# Patient Record
Sex: Female | Born: 1960 | Race: White | Hispanic: No | Smoking: Current every day smoker
Health system: Southern US, Community
[De-identification: ages and names within clinical notes are randomized; demographics above are authoritative.]

## PROBLEM LIST (undated history)

## (undated) DIAGNOSIS — M419 Scoliosis, unspecified: Secondary | ICD-10-CM

## (undated) DIAGNOSIS — Q899 Congenital malformation, unspecified: Secondary | ICD-10-CM

## (undated) DIAGNOSIS — M459 Ankylosing spondylitis of unspecified sites in spine: Secondary | ICD-10-CM

## (undated) DIAGNOSIS — F32A Depression, unspecified: Secondary | ICD-10-CM

## (undated) DIAGNOSIS — M4800 Spinal stenosis, site unspecified: Secondary | ICD-10-CM

## (undated) DIAGNOSIS — I2510 Atherosclerotic heart disease of native coronary artery without angina pectoris: Secondary | ICD-10-CM

## (undated) DIAGNOSIS — E785 Hyperlipidemia, unspecified: Secondary | ICD-10-CM

## (undated) DIAGNOSIS — D682 Hereditary deficiency of other clotting factors: Secondary | ICD-10-CM

## (undated) DIAGNOSIS — M1990 Unspecified osteoarthritis, unspecified site: Secondary | ICD-10-CM

## (undated) DIAGNOSIS — E119 Type 2 diabetes mellitus without complications: Secondary | ICD-10-CM

## (undated) DIAGNOSIS — F329 Major depressive disorder, single episode, unspecified: Secondary | ICD-10-CM

## (undated) DIAGNOSIS — F909 Attention-deficit hyperactivity disorder, unspecified type: Secondary | ICD-10-CM

## (undated) DIAGNOSIS — Z87828 Personal history of other (healed) physical injury and trauma: Secondary | ICD-10-CM

## (undated) DIAGNOSIS — N289 Disorder of kidney and ureter, unspecified: Secondary | ICD-10-CM

## (undated) DIAGNOSIS — E063 Autoimmune thyroiditis: Secondary | ICD-10-CM

## (undated) HISTORY — PX: CORONARY ANGIOPLASTY: SHX604

## (undated) HISTORY — PX: TUBAL LIGATION: SHX77

## (undated) HISTORY — PX: CARDIAC CATHETERIZATION: SHX172

## (undated) HISTORY — PX: CHOLECYSTECTOMY: SHX55

---

## 2011-09-22 ENCOUNTER — Ambulatory Visit: Payer: Self-pay

## 2015-12-01 ENCOUNTER — Encounter: Payer: Self-pay | Admitting: Emergency Medicine

## 2015-12-01 ENCOUNTER — Emergency Department
Admission: EM | Admit: 2015-12-01 | Discharge: 2015-12-03 | Disposition: A | Discharge status: Other Acute Inpatient Hospital | Payer: Medicare Other | Attending: Student in an Organized Health Care Education/Training Program | Admitting: Student in an Organized Health Care Education/Training Program

## 2015-12-01 DIAGNOSIS — R45851 Suicidal ideations: Secondary | ICD-10-CM | POA: Diagnosis present

## 2015-12-01 DIAGNOSIS — T1491XA Suicide attempt, initial encounter: Secondary | ICD-10-CM

## 2015-12-01 DIAGNOSIS — T383X2A Poisoning by insulin and oral hypoglycemic [antidiabetic] drugs, intentional self-harm, initial encounter: Secondary | ICD-10-CM | POA: Diagnosis not present

## 2015-12-01 DIAGNOSIS — IMO0001 Reserved for inherently not codable concepts without codable children: Secondary | ICD-10-CM

## 2015-12-01 DIAGNOSIS — Z7984 Long term (current) use of oral hypoglycemic drugs: Secondary | ICD-10-CM | POA: Insufficient documentation

## 2015-12-01 DIAGNOSIS — F322 Major depressive disorder, single episode, severe without psychotic features: Secondary | ICD-10-CM

## 2015-12-01 DIAGNOSIS — E119 Type 2 diabetes mellitus without complications: Secondary | ICD-10-CM

## 2015-12-01 DIAGNOSIS — F17200 Nicotine dependence, unspecified, uncomplicated: Secondary | ICD-10-CM | POA: Diagnosis not present

## 2015-12-01 DIAGNOSIS — R079 Chest pain, unspecified: Secondary | ICD-10-CM | POA: Diagnosis not present

## 2015-12-01 HISTORY — DX: Spinal stenosis, site unspecified: M48.00

## 2015-12-01 HISTORY — DX: Scoliosis, unspecified: M41.9

## 2015-12-01 HISTORY — DX: Ankylosing spondylitis of unspecified sites in spine: M45.9

## 2015-12-01 HISTORY — DX: Congenital malformation, unspecified: Q89.9

## 2015-12-01 HISTORY — DX: Autoimmune thyroiditis: E06.3

## 2015-12-01 HISTORY — DX: Hereditary deficiency of other clotting factors: D68.2

## 2015-12-01 HISTORY — DX: Unspecified osteoarthritis, unspecified site: M19.90

## 2015-12-01 HISTORY — DX: Personal history of other (healed) physical injury and trauma: Z87.828

## 2015-12-01 HISTORY — DX: Disorder of kidney and ureter, unspecified: N28.9

## 2015-12-01 LAB — CBC
HCT: 48.2 % — ABNORMAL HIGH (ref 35.0–47.0)
Hemoglobin: 16.4 g/dL — ABNORMAL HIGH (ref 12.0–16.0)
MCH: 31.3 pg (ref 26.0–34.0)
MCHC: 34 g/dL (ref 32.0–36.0)
MCV: 92.1 fL (ref 80.0–100.0)
PLATELETS: 204 10*3/uL (ref 150–440)
RBC: 5.24 MIL/uL — ABNORMAL HIGH (ref 3.80–5.20)
RDW: 13.2 % (ref 11.5–14.5)
WBC: 9.7 10*3/uL (ref 3.6–11.0)

## 2015-12-01 LAB — ACETAMINOPHEN LEVEL

## 2015-12-01 LAB — COMPREHENSIVE METABOLIC PANEL
ALK PHOS: 104 U/L (ref 38–126)
ALT: 14 U/L (ref 14–54)
ANION GAP: 9 (ref 5–15)
AST: 16 U/L (ref 15–41)
Albumin: 4.1 g/dL (ref 3.5–5.0)
BILIRUBIN TOTAL: 0.7 mg/dL (ref 0.3–1.2)
BUN: 12 mg/dL (ref 6–20)
CALCIUM: 9.6 mg/dL (ref 8.9–10.3)
CO2: 21 mmol/L — AB (ref 22–32)
Chloride: 106 mmol/L (ref 101–111)
Creatinine, Ser: 0.6 mg/dL (ref 0.44–1.00)
GFR calc non Af Amer: >60 mL/min (ref >60)
Glucose, Bld: 140 mg/dL — ABNORMAL HIGH (ref 65–99)
Potassium: 3.9 mmol/L (ref 3.5–5.1)
SODIUM: 136 mmol/L (ref 135–145)
TOTAL PROTEIN: 7.8 g/dL (ref 6.5–8.1)

## 2015-12-01 LAB — ETHANOL

## 2015-12-01 LAB — GLUCOSE, CAPILLARY
GLUCOSE-CAPILLARY: 137 mg/dL — AB (ref 65–99)
Glucose-Capillary: 139 mg/dL — ABNORMAL HIGH (ref 65–99)

## 2015-12-01 LAB — SALICYLATE LEVEL

## 2015-12-01 MED ORDER — METFORMIN HCL 500 MG PO TABS: 500.0000 mg | ORAL_TABLET | Freq: Two times a day (BID) | ORAL | Status: DC

## 2015-12-01 MED ORDER — LINAGLIPTIN 5 MG PO TABS
5.0000 mg | ORAL_TABLET | Freq: Every day | ORAL | Status: DC
Start: 2015-12-01 — End: 2015-12-03

## 2015-12-01 MED ORDER — INSULIN ASPART 100 UNIT/ML ~~LOC~~ SOLN: 0.0000 [IU] | Freq: Three times a day (TID) | SUBCUTANEOUS | Status: DC

## 2015-12-01 MED ORDER — HALOPERIDOL LACTATE 5 MG/ML IJ SOLN: 5.0000 mg | Freq: Once | INTRAMUSCULAR | Status: DC

## 2015-12-01 MED ORDER — MIDAZOLAM HCL 2 MG/2ML IJ SOLN
2.0000 mg | Freq: Once | INTRAMUSCULAR | Status: AC
  Administered 2015-12-01: 2 mg via INTRAMUSCULAR
  Filled 2015-12-01: qty 2

## 2015-12-01 MED ORDER — ZIPRASIDONE MESYLATE 20 MG IM SOLR
15.0000 mg | Freq: Once | INTRAMUSCULAR | Status: AC
  Administered 2015-12-01: 15 mg via INTRAMUSCULAR

## 2015-12-01 MED ORDER — ZIPRASIDONE MESYLATE 20 MG IM SOLR
INTRAMUSCULAR | Status: AC
  Administered 2015-12-01: 15 mg via INTRAMUSCULAR
  Filled 2015-12-01: qty 20

## 2015-12-01 NOTE — ED Provider Notes (Signed)
[facility] Emergency Department Provider Note    None    (approximate)  I have reviewed the triage vital signs and the nursing notes.   HISTORY  Chief Complaint Suicidal    HPI Melanie Irwin is a 55 y.o. female multiple comorbidities presents with reported overdose of July B last night. States that she ingested over 30 pills. Patient states that she no R was to live. In ER patient appears very disorganized very poor historian. States that her future plan for suicidal be to overdose. She denies any headache, chest pain, hallucinations, nausea, vomiting, diarrhea or fevers. Denies any other substance abuse.   Past Medical History:  Diagnosis Date  . Ankylosing spondylitis (HCC)   . Arthritis   . Birth defect    Spinal cord  . Factor II deficiency (HCC)   . Hashimoto's thyroiditis   . History of spinal cord injury   . Inflammatory arthritis   . Renal disorder   . Scoliosis   . Spinal stenosis    No family history on file. Past Surgical History:  Procedure Laterality Date  . CHOLECYSTECTOMY    . TUBAL LIGATION     There are no active problems to display for this patient.     Prior to Admission medications   Not on File    Allergies Vancomycin    Social History Social History  Substance Use Topics  . Smoking status: Current Every Day Smoker  . Smokeless tobacco: Never Used  . Alcohol use No    Review of Systems Patient denies headaches, rhinorrhea, blurry vision, numbness, shortness of breath, chest pain, edema, cough, abdominal pain, nausea, vomiting, diarrhea, dysuria, fevers, rashes or hallucinations unless otherwise stated above in HPI. ____________________________________________   PHYSICAL EXAM:  VITAL SIGNS: Vitals:   12/01/15 1449  BP: 118/84  Pulse: 92  Resp: 16  Temp: 97.6 F (36.4 C)    Constitutional: Alert and oriented. Eyes: Conjunctivae are normal. PERRL. EOMI. Head: Atraumatic. Nose: No  congestion/rhinnorhea. Mouth/Throat: Mucous membranes are moist.  Oropharynx non-erythematous. Neck: No stridor. Painless ROM. No cervical spine tenderness to palpation Hematological/Lymphatic/Immunilogical: No cervical lymphadenopathy. Cardiovascular: Normal rate, regular rhythm. Grossly normal heart sounds.  Good peripheral circulation. Respiratory: Normal respiratory effort.  No retractions. Lungs CTAB. Gastrointestinal: Soft and nontender. No distention. No abdominal bruits. No CVA tenderness.  Musculoskeletal: No lower extremity tenderness nor edema.  No joint effusions. Neurologic:  Normal speech and language. No gross focal neurologic deficits are appreciated. No gait instability. Skin:  Skin is warm, dry and intact. No rash noted. Psychiatric:disorganized, agitated, pacing in hall ____________________________________________   LABS (all labs ordered are listed, but only abnormal results are displayed)  Results for orders placed or performed during the hospital encounter of 12/01/15 (from the past 24 hour(s))  Comprehensive metabolic panel     Status: Abnormal   Collection Time: 12/01/15  3:06 PM  Result Value Ref Range   Sodium 136 135 - 145 mmol/L   Potassium 3.9 3.5 - 5.1 mmol/L   Chloride 106 101 - 111 mmol/L   CO2 21 (L) 22 - 32 mmol/L   Glucose, Bld 140 (H) 65 - 99 mg/dL   BUN 12 6 - 20 mg/dL   Creatinine, Ser 6.29 0.44 - 1.00 mg/dL   Calcium 9.6 8.9 - 52.8 mg/dL   Total Protein 7.8 6.5 - 8.1 g/dL   Albumin 4.1 3.5 - 5.0 g/dL   AST 16 15 - 41 U/L   ALT 14 14 -  54 U/L   Alkaline Phosphatase 104 38 - 126 U/L   Total Bilirubin 0.7 0.3 - 1.2 mg/dL   GFR calc non Af Amer >60 >60 mL/min   GFR calc Af Amer >60 >60 mL/min   Anion gap 9 5 - 15  Ethanol     Status: None   Collection Time: 12/01/15  3:06 PM  Result Value Ref Range   Alcohol, Ethyl (B) <5 <5 mg/dL  Salicylate level     Status: None   Collection Time: 12/01/15  3:06 PM  Result Value Ref Range   Salicylate  Lvl <7.0 2.8 - 30.0 mg/dL  Acetaminophen level     Status: Abnormal   Collection Time: 12/01/15  3:06 PM  Result Value Ref Range   Acetaminophen (Tylenol), Serum <10 (L) 10 - 30 ug/mL  cbc     Status: Abnormal   Collection Time: 12/01/15  3:06 PM  Result Value Ref Range   WBC 9.7 3.6 - 11.0 K/uL   RBC 5.24 (H) 3.80 - 5.20 MIL/uL   Hemoglobin 16.4 (H) 12.0 - 16.0 g/dL   HCT 32.448.2 (H) 40.135.0 - 02.747.0 %   MCV 92.1 80.0 - 100.0 fL   MCH 31.3 26.0 - 34.0 pg   MCHC 34.0 32.0 - 36.0 g/dL   RDW 25.313.2 66.411.5 - 40.314.5 %   Platelets 204 150 - 440 K/uL  Glucose, capillary     Status: Abnormal   Collection Time: 12/01/15  3:07 PM  Result Value Ref Range   Glucose-Capillary 139 (H) 65 - 99 mg/dL   ____________________________________________ ____________________________________________  RADIOLOGY   ____________________________________________   PROCEDURES  Procedure(s) performed: none Procedures    Critical Care performed: no ____________________________________________   INITIAL IMPRESSION / ASSESSMENT AND PLAN / ED COURSE  Pertinent labs & imaging results that were available during my care of the patient were reviewed by me and considered in my medical decision making (see chart for details).  DDX: Psychosis, delirium, medication effect, noncompliance, polysubstance abuse, Si, Hi, depression   Melanie Irwin is a 55 y.o. who presents to the ED with for evaluation of SI.  Patient states that she overdosed on 30 pills of Januvia last night. Not sure that this is accurate as the patient has normal glucose levels and is in no acute distress at nearly 24 hours after ingestion. Will continue to monitor for a period time here in the ER. Will check blood work.    Clinical Course as of Nov 30 2033  Mon Dec 01, 2015  1626 I spoke with poison control who states that now that she is nearly 24 hours out from possible ingestion does not require any further monitoring as her LFTs, renal function  and Tylenol levels are normal.  [PR]    Clinical Course User Index [PR] Willy EddyPatrick Loana Salvaggio, MD     Laboratory testing was ordered to evaluation for underlying electrolyte derangement or signs of underlying organic pathology to explain today's presentation.  Based on history and physical and laboratory evaluation, it appears that the patient's presentation is 2/2 underlying psychiatric disorder and will require further evaluation and management by inpatient psychiatry.  Patient was  made an IVC due to active SI.  Disposition pending psychiatric evaluation.   ____________________________________________   FINAL CLINICAL IMPRESSION(S) / ED DIAGNOSES  Final diagnoses:  Suicidal ideation  Drug ingestion, intentional self-harm, initial encounter (HCC)      NEW MEDICATIONS STARTED DURING THIS VISIT:  New Prescriptions   No medications on file  Note:  This document was prepared using Dragon voice recognition software and may include unintentional dictation errors.    Willy Eddy, MD 12/01/15 2035

## 2015-12-01 NOTE — BH Assessment (Signed)
Assessment Note  Melanie Irwin is an 55 y.o. female. [name] reports to the ED by transportation from her probation officer for suicidal attempt by overdose.  She reported that she took a bottle of Januvia last night to kill herself.  [name] was uncooperative with the TTS.  She denied to answer many of the questions.  She was irritable and kept the blanket pulled over her head.  [name] is reported as being loud and verbally aggressive in the ED. Patient was IVC'd by the physician after making suicidal statements.  Patient would not answer questions on symptoms of depression  or anxiety, nor did she answer whether she was having hallucinations.  Diagnosis: SI  Past Medical History:  Past Medical History:  Diagnosis Date  . Ankylosing spondylitis (HCC)   . Arthritis   . Birth defect    Spinal cord  . Factor II deficiency (HCC)   . Hashimoto's thyroiditis   . History of spinal cord injury   . Inflammatory arthritis   . Renal disorder   . Scoliosis   . Spinal stenosis     Past Surgical History:  Procedure Laterality Date  . CHOLECYSTECTOMY    . TUBAL LIGATION      Family History: No family history on file.  Social History:  reports that she has been smoking.  She has never used smokeless tobacco. She reports that she does not drink alcohol. Her drug history is not on file.  Additional Social History:  Alcohol / Drug Use History of alcohol / drug use?: No history of alcohol / drug abuse  CIWA: CIWA-Ar BP: 118/84 Pulse Rate: 92 COWS:    Allergies:  Allergies  Allergen Reactions  . Vancomycin Anaphylaxis    Home Medications:  (Not in a hospital admission)  OB/GYN Status:  No LMP recorded. Patient is postmenopausal.  General Assessment Data Location of Assessment: [facility] ED TTS Assessment: In system Is this a Tele or Face-to-Face Assessment?: Face-to-Face Is this an Initial Assessment or a Re-assessment for this encounter?: Initial Assessment Marital status:  Divorced [place] name: n/a Is patient pregnant?: No Pregnancy Status: No Living Arrangements: Other (Comment) (Refused to answer) Can pt return to current living arrangement?: Yes Admission Status: Involuntary Is patient capable of signing voluntary admission?: Yes Referral Source: Self/Family/Friend Insurance type: Medicare  Medical Screening Exam [facility] Walk-in ONLY) Medical Exam completed: Yes  Crisis Care Plan Living Arrangements: Other (Comment) (Refused to answer) Legal Guardian: Other: (Self) Name of Psychiatrist: Refused to answer Name of Therapist: Refused to answer  Education Status Is patient currently in school?: No Current Grade: n/a Highest grade of school patient has completed: n/a Name of school: n/a Contact person: n/a  Risk to self with the past 6 months Suicidal Ideation: Yes-Currently Present Has patient been a risk to self within the past 6 months prior to admission? : Other (comment) Suicidal Intent:  (unknown) Has patient had any suicidal intent within the past 6 months prior to admission? : Other (comment) (unknown) Is patient at risk for suicide?: No Suicidal Plan?:  (Would not disclose to TTS) Has patient had any suicidal plan within the past 6 months prior to admission? : Other (comment) (Unknown) Access to Means: No What has been your use of drugs/alcohol within the last 12 months?: denied use Previous Attempts/Gestures:  (Unknown) How many times?:  (unknown) Other Self Harm Risks:  (denied) Triggers for Past Attempts: Unknown Intentional Self Injurious Behavior: None Family Suicide History: Unknown Recent stressful life event(s):  (  would not disclose) Persecutory voices/beliefs?: No Depression: Yes Depression Symptoms: Feeling angry/irritable Substance abuse history and/or treatment for substance abuse?: No Suicide prevention information given to non-admitted patients: Not applicable  Risk to Others within the past 6 months Homicidal  Ideation: No Does patient have any lifetime risk of violence toward others beyond the six months prior to admission? : No Thoughts of Harm to Others: No Current Homicidal Intent: No Current Homicidal Plan: No Access to Homicidal Means: No Identified Victim: None identified History of harm to others?: No Assessment of Violence: None Noted Violent Behavior Description: denied Does patient have access to weapons?: No Criminal Charges Pending?: No Does patient have a court date: No Is patient on probation?: Yes  Psychosis Hallucinations: None noted Delusions: None noted  Mental Status Report Appearance/Hygiene: In scrubs Eye Contact: Poor Motor Activity: Unremarkable Speech: Aggressive Level of Consciousness: Quiet/awake Mood: Irritable Affect: Irritable Anxiety Level: None Thought Processes: Coherent Judgement: Unimpaired Orientation: Person, Place, Time, Situation Obsessive Compulsive Thoughts/Behaviors: None  Cognitive Functioning Concentration: Unable to Assess Memory: Unable to Assess IQ: Average Insight: Unable to Assess Impulse Control: Poor Appetite:  (refused to answer) Sleep: Unable to Assess Vegetative Symptoms: None  ADLScreening [facility]) Patient's cognitive ability adequate to safely complete daily activities?: Yes Patient able to express need for assistance with ADLs?: Yes Independently performs ADLs?: Yes (appropriate for developmental age)  Prior Inpatient Therapy Prior Inpatient Therapy: No (Refused to answer) Prior Therapy Dates: n/a Prior Therapy Facilty/Provider(s): n/a Reason for Treatment: n/a  Prior Outpatient Therapy Prior Outpatient Therapy: No (Refused to answer) Prior Therapy Dates: n/a Prior Therapy Facilty/Provider(s): n/a Reason for Treatment: n/a Does patient have an ACCT team?: No Does patient have Intensive In-House Services?  : No Does patient have [facility] services? : No Does patient have P4CC services?:  No  ADL Screening (condition at time of admission) Patient's cognitive ability adequate to safely complete daily activities?: Yes Patient able to express need for assistance with ADLs?: Yes Independently performs ADLs?: Yes (appropriate for developmental age)       Abuse/Neglect Assessment (Assessment to be complete while patient is alone) Physical Abuse: Denies Verbal Abuse: Denies Sexual Abuse: Denies Exploitation of patient/patient's resources: Denies Self-Neglect: Denies          Additional Information 1:1 In Past 12 Months?: No CIRT Risk: No Elopement Risk: No Does patient have medical clearance?: Yes     Disposition:  Disposition Initial Assessment Completed for this Encounter: Yes Disposition of Patient: Inpatient treatment program  On Site Evaluation by:   Reviewed with Physician:    Justice DeedsKeisha Caidon Foti 12/01/2015 8:38 PM

## 2015-12-01 NOTE — ED Notes (Addendum)
Pt yelling out "oh" and "I want to go home".  Informed pt that behavior was inappropriate, that she was in hall bed and needed to keep voice at reasonable level.  Pt informed by officer that such behavior would likely increase pts lentgh of stay.  Pt continued to yell out, MD Roxan Hockeyobinson informed.  Pt complaint w/ IM injection

## 2015-12-01 NOTE — ED Triage Notes (Addendum)
Patient to ER for c/o taking whole bottle of Januvia (approx 30 pills) last night. States her probation officer brought her to ER to be evaluated. When asked what she is here for, she states "nothing, that she is fine". Patient with arms crossed in triage, and appears mad. Patient answers questions when asked, but is not forthcoming with whole story until directly asked questions.  States pills were taken before midnight. Patient states "I'm just tired of living.".

## 2015-12-01 NOTE — ED Notes (Addendum)
Pt unforthcoming w/ information, repeatedly sts that she does not want to be here.  Pt sts that she took medications last night w/ that intention of hurting self.  Pt sts that she does not currently want to hurt self because "I have things to get in order".  When this RN asked if she would hurt herself again after she attended to her affairs, pt responded yes.

## 2015-12-01 NOTE — ED Notes (Signed)
Pt refusing EKG or in and out cath for urine.

## 2015-12-01 NOTE — Consult Note (Signed)
[facility] Consult   Reason for Consult:  Consult for 55 year old woman who came in to the hospital after taking a overdose Referring Physician:  Quentin Cornwall Patient Identification: Melanie Irwin MRN:  [id number] Principal Diagnosis: Severe major depression, single episode, without psychotic features [facility]) Diagnosis:   Patient Active Problem List   Diagnosis Date Noted  . Severe major depression, single episode, without psychotic features ([place]) [F32.2] 12/01/2015  . Suicide attempt [T14.91XA] 12/01/2015  . Diabetes ([place]) [E11.9] 12/01/2015    Total Time spent with patient: 1 hour  Subjective:   Melanie Irwin is a 55 y.o. female patient admitted with "I took some Januvia to hurt myself".  HPI:  Patient interviewed. Chart reviewed. 55 year old woman came to the emergency room after going to her probation officer today and telling her that she had taken an overdose of her Januvia. Patient says she took about 10 tablets of it yesterday because she wanted to hurt her self. Her mood has been bad recently and she's been having suicidal thoughts and feeling angry at herself and other people. Sleep is poor. Appetite is poor. Symptoms. Says that she has been compliant with her recommended outpatient medication.  Social history: Lives with her sister and several other people. Not clear if she is working right now. On probation.  Medical history: History of ankylosing spondylitis, diabetes, chronic pain  Substance abuse history: She denies any but she is not a very complete historian  Past Psychiatric History: Patient says she has never been in a psychiatric hospital. Used to take Lexapro in the past for depression. Won't tell me whether it was ever helpful. No history of previous suicide attempts  Risk to Self: Suicidal Ideation: Yes-Currently Present Suicidal Intent:  (unknown) Is patient at risk for suicide?: No Suicidal Plan?:  (Would not disclose to TTS) Access to Means:  No What has been your use of drugs/alcohol within the last 12 months?: denied use How many times?:  (unknown) Other Self Harm Risks:  (denied) Triggers for Past Attempts: Unknown Intentional Self Injurious Behavior: None Risk to Others: Homicidal Ideation: No Thoughts of Harm to Others: No Current Homicidal Intent: No Current Homicidal Plan: No Access to Homicidal Means: No Identified Victim: None identified History of harm to others?: No Assessment of Violence: None Noted Violent Behavior Description: denied Does patient have access to weapons?: No Criminal Charges Pending?: No Does patient have a court date: No Prior Inpatient Therapy: Prior Inpatient Therapy: No (Refused to answer) Prior Therapy Dates: n/a Prior Therapy Facilty/Provider(s): n/a Reason for Treatment: n/a Prior Outpatient Therapy: Prior Outpatient Therapy: No (Refused to answer) Prior Therapy Dates: n/a Prior Therapy Facilty/Provider(s): n/a Reason for Treatment: n/a Does patient have an ACCT team?: No Does patient have Intensive In-House Services?  : No Does patient have [facility] services? : No Does patient have P4CC services?: No  Past Medical History:  Past Medical History:  Diagnosis Date  . Ankylosing spondylitis ([place])   . Arthritis   . Birth defect    Spinal cord  . 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 Hashimoto's thyroiditis   . History of spinal cord injury   . Inflammatory arthritis   . Renal disorder   . Scoliosis   . Spinal stenosis     Past Surgical History:  Procedure Laterality Date  . CHOLECYSTECTOMY    . TUBAL LIGATION     Family History: No family history on file. Family Psychiatric  History: Denies family history of mental illness Social History:  History  Alcohol Use No     History  Drug use: Unknown    Social History   Social History  . Marital status: Divorced    Spouse name: N/A  . Number of children: N/A  . Years of education: N/A   Social History Main Topics  .  Smoking status: Current Every Day Smoker  . Smokeless tobacco: Never Used  . Alcohol use No  . Drug use: Unknown  . Sexual activity: Not Asked   Other Topics Concern  . None   Social History Narrative  . None   Additional Social History:    Allergies:   Allergies  Allergen Reactions  . Vancomycin Anaphylaxis    Labs:  Results for orders placed or performed during the hospital encounter of 12/01/15 (from the past 48 hour(s))  Comprehensive metabolic panel     Status: Abnormal   Collection Time: 12/01/15  3:06 PM  Result Value Ref Range   Sodium 136 135 - 145 mmol/L   Potassium 3.9 3.5 - 5.1 mmol/L   Chloride 106 101 - 111 mmol/L   CO2 21 (L) 22 - 32 mmol/L   Glucose, Bld 140 (H) 65 - 99 mg/dL   BUN 12 6 - 20 mg/dL   Creatinine, Ser 0.60 0.44 - 1.00 mg/dL   Calcium 9.6 8.9 - 10.3 mg/dL   Total Protein 7.8 6.5 - 8.1 g/dL   Albumin 4.1 3.5 - 5.0 g/dL   AST 16 15 - 41 U/L   ALT 14 14 - 54 U/L   Alkaline Phosphatase 104 38 - 126 U/L   Total Bilirubin 0.7 0.3 - 1.2 mg/dL   GFR calc non Af Amer >60 >60 mL/min   GFR calc Af Amer >60 >60 mL/min    Comment: (NOTE) The eGFR has been calculated using the CKD EPI equation. This calculation has not been validated in all clinical situations. eGFR's persistently <60 mL/min signify possible Chronic Kidney Disease.    Anion gap 9 5 - 15  Ethanol     Status: None   Collection Time: 12/01/15  3:06 PM  Result Value Ref Range   Alcohol, Ethyl (B) <5 <5 mg/dL    Comment:        LOWEST DETECTABLE LIMIT FOR SERUM ALCOHOL IS 5 mg/dL FOR MEDICAL PURPOSES ONLY   Salicylate level     Status: None   Collection Time: 12/01/15  3:06 PM  Result Value Ref Range   Salicylate Lvl <5.0 2.8 - 30.0 mg/dL  Acetaminophen level     Status: Abnormal   Collection Time: 12/01/15  3:06 PM  Result Value Ref Range   Acetaminophen (Tylenol), Serum <10 (L) 10 - 30 ug/mL    Comment:        THERAPEUTIC CONCENTRATIONS VARY SIGNIFICANTLY. A RANGE OF  10-30 ug/mL MAY BE AN EFFECTIVE CONCENTRATION FOR MANY PATIENTS. HOWEVER, SOME ARE BEST TREATED AT CONCENTRATIONS OUTSIDE THIS RANGE. ACETAMINOPHEN CONCENTRATIONS >150 ug/mL AT 4 HOURS AFTER INGESTION AND >50 ug/mL AT 12 HOURS AFTER INGESTION ARE OFTEN ASSOCIATED WITH TOXIC REACTIONS.   cbc     Status: Abnormal   Collection Time: 12/01/15  3:06 PM  Result Value Ref Range   WBC 9.7 3.6 - 11.0 K/uL   RBC 5.24 (H) 3.80 - 5.20 MIL/uL   Hemoglobin 16.4 (H) 12.0 - 16.0 g/dL   HCT 48.2 (H) 35.0 - 47.0 %   MCV 92.1 80.0 - 100.0 fL   MCH 31.3 26.0 - 34.0 pg   MCHC 34.0 32.0 -  36.0 g/dL   RDW 13.2 11.5 - 14.5 %   Platelets 204 150 - 440 K/uL    Comment: COUNT MAY BE INACCURATE DUE TO FIBRIN CLUMPS.  Glucose, capillary     Status: Abnormal   Collection Time: 12/01/15  3:07 PM  Result Value Ref Range   Glucose-Capillary 139 (H) 65 - 99 mg/dL    Current Facility-Administered Medications  Medication Dose Route Frequency Provider Last Rate Last Dose  . [START ON 12/02/2015] insulin aspart (novoLOG) injection 0-15 Units  0-15 Units Subcutaneous TID WC Gonzella Lex, MD      . linagliptin (TRADJENTA) tablet 5 mg  5 mg Oral Daily Gonzella Lex, MD      . Derrill Memo ON 12/02/2015] metFORMIN (GLUCOPHAGE) tablet 500 mg  500 mg Oral BID WC Gonzella Lex, MD       No current outpatient prescriptions on file.    Musculoskeletal: Strength & Muscle Tone: decreased Gait & Station: normal Patient leans: N/A  Psychiatric Specialty Exam: Physical Exam  Nursing note and vitals reviewed. Constitutional: She appears well-developed and well-nourished.  HENT:  Head: Normocephalic and atraumatic.  Eyes: Conjunctivae are normal. Pupils are equal, round, and reactive to light.  Neck: Normal range of motion.  Cardiovascular: Regular rhythm and normal heart sounds.   Respiratory: Effort normal. No respiratory distress.  GI: Soft.  Musculoskeletal: Normal range of motion.  Neurological: She is alert.   Skin: Skin is warm and dry.  Psychiatric: Her speech is delayed. She is slowed. Cognition and memory are normal. She expresses impulsivity. She exhibits a depressed mood. She expresses suicidal ideation. She expresses suicidal plans.    Review of Systems  Constitutional: Negative.   HENT: Negative.   Eyes: Negative.   Respiratory: Negative.   Cardiovascular: Negative.   Gastrointestinal: Negative.   Musculoskeletal: Negative.   Skin: Negative.   Neurological: Negative.   Psychiatric/Behavioral: Positive for depression and suicidal ideas. Negative for hallucinations, memory loss and substance abuse. The patient is nervous/anxious and has insomnia.     Blood pressure 118/84, pulse 92, temperature 97.6 F (36.4 C), temperature source Oral, resp. rate 16, height 5' 3"  (1.6 m), weight 68 kg (150 lb), SpO2 93 %.Body mass index is 26.57 kg/m.  General Appearance: Disheveled  Eye Contact:  None  Speech:  Slow  Volume:  Decreased  Mood:  Irritable  Affect:  Flat  Thought Process:  Goal Directed  Orientation:  Full (Time, Place, and Person)  Thought Content:  Tangential  Suicidal Thoughts:  Yes.  with intent/plan  Homicidal Thoughts:  No  Memory:  Immediate;   Good Recent;   Fair Remote;   Fair  Judgement:  Impaired  Insight:  Lacking  Psychomotor Activity:  Decreased  Concentration:  Concentration: Poor  Recall:  Poor  Fund of Knowledge:  Poor  Language:  Fair  Akathisia:  No  Handed:  Right  AIMS (if indicated):     Assets:  Housing  ADL's:  Intact  Cognition:  WNL  Sleep:        Treatment Plan Summary: Daily contact with patient to assess and evaluate symptoms and progress in treatment, Medication management and Plan 55 year old woman who took an overdose of prescription medicine. Currently very withdrawn minimal interaction. Lab stable however. Does not require further medical management. Patient will be admitted to the psychiatry service. Continue her diabetes medicine.  No other medicines listed currently. When necessary for sleep and anxiety. Coverage for blood sugars ordered. Continue 15 minute  checks and reevaluation 1 she is on the psychiatry unit.  Disposition: Recommend psychiatric Inpatient admission when medically cleared. Supportive therapy provided about ongoing stressors.  Alethia Berthold, MD 12/01/2015 9:19 PM

## 2015-12-01 NOTE — ED Notes (Signed)
Pts sister called, left name and phone number to be reached.  Tonia BroomsLinda Knowles-Jonas [phone]

## 2015-12-02 LAB — GLUCOSE, CAPILLARY
GLUCOSE-CAPILLARY: 152 mg/dL — AB (ref 65–99)
GLUCOSE-CAPILLARY: 117 mg/dL — AB (ref 65–99)
Glucose-Capillary: 179 mg/dL — ABNORMAL HIGH (ref 65–99)

## 2015-12-02 MED ORDER — HALOPERIDOL LACTATE 5 MG/ML IJ SOLN
5.0000 mg | Freq: Once | INTRAMUSCULAR | Status: AC
  Administered 2015-12-02: 5 mg via INTRAMUSCULAR
  Filled 2015-12-02 (×2): qty 1

## 2015-12-02 NOTE — ED Notes (Signed)
Pt sleeping at this time. Sitter at bedside. 

## 2015-12-02 NOTE — ED Notes (Signed)
Breakfast was given to patient ,patient refused ,but she placed tray in bed with her, notified nurse Bill S.

## 2015-12-02 NOTE — ED Notes (Signed)
With pts permission I updated pts sister, Sharma CovertLynde Knowles-Jones, home # [phone], on pts condition and gave her the passcode.

## 2015-12-02 NOTE — ED Notes (Signed)
BEHAVIORAL HEALTH ROUNDING Patient sleeping: Yes.   Patient alert and oriented: not applicable Behavior appropriate: Yes.  ; If no, describe:  Nutrition and fluids offered: No Toileting and hygiene offered: No Sitter present: yes Law enforcement present: Yes   

## 2015-12-02 NOTE — BH Assessment (Signed)
Patient admitted to [facility] per [facility] charge nurse, Bed 313, MDD, single episode, without psychosis, Admitting Dr. Toni Amend, Attending Dr. Ardyth Harps

## 2015-12-02 NOTE — ED Notes (Addendum)
Received report from night shift. On reviewing chart there was an active order from 12/01/15 @ 1511 for a suicide sitter at bedside. When I arrived there was not a sitter at bedside. Pt was sleeping. Notified Dr. Scotty Court who d/c order.

## 2015-12-02 NOTE — ED Notes (Signed)
Patient refused shower notified nurse Bill S.

## 2015-12-02 NOTE — ED Provider Notes (Addendum)
-----------------------------------------   11:06 AM on 12/02/2015 -----------------------------------------   Blood pressure 116/72, pulse 68, temperature 98.1 F (36.7 C), temperature source Oral, resp. rate 20, height 5\' 3"  (1.6 m), weight 150 lb (68 kg), SpO2 94 %.  The patient had no acute events since last update.  Calm and cooperative at this time.  Disposition is pending Psychiatry/Behavioral Medicine team recommendations.     Sharman CheekPhillip Vennie Salsbury, MD 12/02/15 1106     ----------------------------------------- 12:36 PM on 12/02/2015 -----------------------------------------  12:00 PM the patient became acutely agitated. She was yelling, violent, turned stretcher over on its side. He demands Zanaflex. Not able to be reoriented. Patient was given intramuscular Haldol for control of agitation for safety of patient and staff. Now at 1235 she is calm. Continue to await psychiatry disposition.   Sharman CheekPhillip Breniya Goertzen, MD 12/03/15 [phone]

## 2015-12-02 NOTE — ED Notes (Signed)
Pt continues to refuse food, liquid, and medications. MD notified.

## 2015-12-02 NOTE — ED Notes (Signed)
Pt keeps shutting the door and cutting the lights out. Pt informed if she wants the light out she has to leave the door open. Pt would only reply "I want my f..ingmeds.

## 2015-12-02 NOTE — ED Provider Notes (Signed)
 -----------------------------------------   9:38 AM on 12/02/2015 -----------------------------------------  EKG obtained, interpreted by me. Normal sinus rhythm rate of 65, normal axis intervals QRS ST segments and T waves.   Sharman Cheek, MD 12/02/15 [phone]

## 2015-12-02 NOTE — Consult Note (Signed)
[facility] Consult   Reason for Consult:  Consult for 55 year old woman who came in to the hospital after taking a overdose Referring Physician:  Quentin Cornwall Patient Identification: Melanie Irwin MRN:  [id number] Principal Diagnosis: Severe major depression, single episode, without psychotic features [facility]) Diagnosis:   Patient Active Problem List   Diagnosis Date Noted  . Severe major depression, single episode, without psychotic features ([place]) [F32.2] 12/01/2015  . Suicide attempt [T14.91XA] 12/01/2015  . Diabetes ([place]) [E11.9] 12/01/2015    Total Time spent with patient: 20 minutes  Subjective:   Melanie Irwin is a 55 y.o. female patient admitted with "I took some Januvia to hurt myself".  Follow-up for this 55 year old woman in the hospital after taking an overdose. Patient seen chart reviewed. Case reviewed with emergency room nursing staff. Patient was very agitated and abusive this morning. Reportedly making multiple statements about wanting to die or wanting to be killed. By the time I came to see her she had been given some sedative again. She was still agitated and rude but not physically aggressive. Patient continues to claim that she is not being given the correct medication although she is not able to tell me what the correct medicine would be.  HPI:  Patient interviewed. Chart reviewed. 55 year old woman came to the emergency room after going to her probation officer today and telling her that she had taken an overdose of her Januvia. Patient says she took about 10 tablets of it yesterday because she wanted to hurt her self. Her mood has been bad recently and she's been having suicidal thoughts and feeling angry at herself and other people. Sleep is poor. Appetite is poor. Symptoms. Says that she has been compliant with her recommended outpatient medication.  Social history: Lives with her sister and several other people. Not clear if she is working right now. On  probation.  Medical history: History of ankylosing spondylitis, diabetes, chronic pain  Substance abuse history: She denies any but she is not a very complete historian  Past Psychiatric History: Patient says she has never been in a psychiatric hospital. Used to take Lexapro in the past for depression. Won't tell me whether it was ever helpful. No history of previous suicide attempts  Risk to Self: Suicidal Ideation: Yes-Currently Present Suicidal Intent:  (unknown) Is patient at risk for suicide?: No Suicidal Plan?:  (Would not disclose to TTS) Access to Means: No What has been your use of drugs/alcohol within the last 12 months?: denied use How many times?:  (unknown) Other Self Harm Risks:  (denied) Triggers for Past Attempts: Unknown Intentional Self Injurious Behavior: None Risk to Others: Homicidal Ideation: No Thoughts of Harm to Others: No Current Homicidal Intent: No Current Homicidal Plan: No Access to Homicidal Means: No Identified Victim: None identified History of harm to others?: No Assessment of Violence: None Noted Violent Behavior Description: denied Does patient have access to weapons?: No Criminal Charges Pending?: No Does patient have a court date: No Prior Inpatient Therapy: Prior Inpatient Therapy: No (Refused to answer) Prior Therapy Dates: n/a Prior Therapy Facilty/Provider(s): n/a Reason for Treatment: n/a Prior Outpatient Therapy: Prior Outpatient Therapy: No (Refused to answer) Prior Therapy Dates: n/a Prior Therapy Facilty/Provider(s): n/a Reason for Treatment: n/a Does patient have an ACCT team?: No Does patient have Intensive In-House Services?  : No Does patient have [facility] services? : No Does patient have P4CC services?: No  Past Medical History:  Past Medical History:  Diagnosis Date  . Ankylosing spondylitis ([place])   .  Arthritis   . Birth defect    Spinal cord  . 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 Hashimoto's thyroiditis   . History of  spinal cord injury   . Inflammatory arthritis   . Renal disorder   . Scoliosis   . Spinal stenosis     Past Surgical History:  Procedure Laterality Date  . CHOLECYSTECTOMY    . TUBAL LIGATION     Family History: No family history on file. Family Psychiatric  History: Denies family history of mental illness Social History:  History  Alcohol Use No     History  Drug use: Unknown    Social History   Social History  . Marital status: Divorced    Spouse name: N/A  . Number of children: N/A  . Years of education: N/A   Social History Main Topics  . Smoking status: Current Every Day Smoker  . Smokeless tobacco: Never Used  . Alcohol use No  . Drug use: Unknown  . Sexual activity: Not Asked   Other Topics Concern  . None   Social History Narrative  . None   Additional Social History:    Allergies:   Allergies  Allergen Reactions  . Vancomycin Anaphylaxis    Labs:  Results for orders placed or performed during the hospital encounter of 12/01/15 (from the past 48 hour(s))  Comprehensive metabolic panel     Status: Abnormal   Collection Time: 12/01/15  3:06 PM  Result Value Ref Range   Sodium 136 135 - 145 mmol/L   Potassium 3.9 3.5 - 5.1 mmol/L   Chloride 106 101 - 111 mmol/L   CO2 21 (L) 22 - 32 mmol/L   Glucose, Bld 140 (H) 65 - 99 mg/dL   BUN 12 6 - 20 mg/dL   Creatinine, Ser 0.60 0.44 - 1.00 mg/dL   Calcium 9.6 8.9 - 10.3 mg/dL   Total Protein 7.8 6.5 - 8.1 g/dL   Albumin 4.1 3.5 - 5.0 g/dL   AST 16 15 - 41 U/L   ALT 14 14 - 54 U/L   Alkaline Phosphatase 104 38 - 126 U/L   Total Bilirubin 0.7 0.3 - 1.2 mg/dL   GFR calc non Af Amer >60 >60 mL/min   GFR calc Af Amer >60 >60 mL/min    Comment: (NOTE) The eGFR has been calculated using the CKD EPI equation. This calculation has not been validated in all clinical situations. eGFR's persistently <60 mL/min signify possible Chronic Kidney Disease.    Anion gap 9 5 - 15  Ethanol     Status: None    Collection Time: 12/01/15  3:06 PM  Result Value Ref Range   Alcohol, Ethyl (B) <5 <5 mg/dL    Comment:        LOWEST DETECTABLE LIMIT FOR SERUM ALCOHOL IS 5 mg/dL FOR MEDICAL PURPOSES ONLY   Salicylate level     Status: None   Collection Time: 12/01/15  3:06 PM  Result Value Ref Range   Salicylate Lvl <4.6 2.8 - 30.0 mg/dL  Acetaminophen level     Status: Abnormal   Collection Time: 12/01/15  3:06 PM  Result Value Ref Range   Acetaminophen (Tylenol), Serum <10 (L) 10 - 30 ug/mL    Comment:        THERAPEUTIC CONCENTRATIONS VARY SIGNIFICANTLY. A RANGE OF 10-30 ug/mL MAY BE AN EFFECTIVE CONCENTRATION FOR MANY PATIENTS. HOWEVER, SOME ARE BEST TREATED AT CONCENTRATIONS OUTSIDE THIS RANGE. ACETAMINOPHEN CONCENTRATIONS >150 ug/mL AT 4  HOURS AFTER INGESTION AND >50 ug/mL AT 12 HOURS AFTER INGESTION ARE OFTEN ASSOCIATED WITH TOXIC REACTIONS.   cbc     Status: Abnormal   Collection Time: 12/01/15  3:06 PM  Result Value Ref Range   WBC 9.7 3.6 - 11.0 K/uL   RBC 5.24 (H) 3.80 - 5.20 MIL/uL   Hemoglobin 16.4 (H) 12.0 - 16.0 g/dL   HCT 48.2 (H) 35.0 - 47.0 %   MCV 92.1 80.0 - 100.0 fL   MCH 31.3 26.0 - 34.0 pg   MCHC 34.0 32.0 - 36.0 g/dL   RDW 13.2 11.5 - 14.5 %   Platelets 204 150 - 440 K/uL    Comment: COUNT MAY BE INACCURATE DUE TO FIBRIN CLUMPS.  Glucose, capillary     Status: Abnormal   Collection Time: 12/01/15  3:07 PM  Result Value Ref Range   Glucose-Capillary 139 (H) 65 - 99 mg/dL  Glucose, capillary     Status: Abnormal   Collection Time: 12/01/15 11:51 PM  Result Value Ref Range   Glucose-Capillary 137 (H) 65 - 99 mg/dL  Glucose, capillary     Status: Abnormal   Collection Time: 12/02/15  8:55 AM  Result Value Ref Range   Glucose-Capillary 152 (H) 65 - 99 mg/dL   Comment 1 Notify RN   Glucose, capillary     Status: Abnormal   Collection Time: 12/02/15  5:09 PM  Result Value Ref Range   Glucose-Capillary 117 (H) 65 - 99 mg/dL    Current  Facility-Administered Medications  Medication Dose Route Frequency Provider Last Rate Last Dose  . insulin aspart (novoLOG) injection 0-15 Units  0-15 Units Subcutaneous TID WC John T Clapacs, MD      . linagliptin (TRADJENTA) tablet 5 mg  5 mg Oral Daily John T Clapacs, MD      . metFORMIN (GLUCOPHAGE) tablet 500 mg  500 mg Oral BID WC Gonzella Lex, MD       Current Outpatient Prescriptions  Medication Sig Dispense Refill  . amphetamine-dextroamphetamine (ADDERALL) 20 MG tablet Take 20 mg by mouth 2 (two) times daily. 2 tablets every morning, 1 tablet afternoon    . clonazePAM (KLONOPIN) 1 MG tablet Take 1 mg by mouth 3 (three) times daily as needed for anxiety.    . sitaGLIPtin (JANUVIA) 100 MG tablet Take 100 mg by mouth daily.      Musculoskeletal: Strength & Muscle Tone: decreased Gait & Station: normal Patient leans: N/A  Psychiatric Specialty Exam: Physical Exam  Nursing note and vitals reviewed. Constitutional: She appears well-developed and well-nourished.  HENT:  Head: Normocephalic and atraumatic.  Eyes: Conjunctivae are normal. Pupils are equal, round, and reactive to light.  Neck: Normal range of motion.  Cardiovascular: Regular rhythm and normal heart sounds.   Respiratory: Effort normal. No respiratory distress.  GI: Soft.  Musculoskeletal: Normal range of motion.  Neurological: She is alert.  Skin: Skin is warm and dry.  Psychiatric: Her speech is delayed. She is slowed. Cognition and memory are normal. She expresses impulsivity. She exhibits a depressed mood. She expresses suicidal ideation. She expresses suicidal plans.    Review of Systems  Constitutional: Negative.   HENT: Negative.   Eyes: Negative.   Respiratory: Negative.   Cardiovascular: Negative.   Gastrointestinal: Negative.   Musculoskeletal: Negative.   Skin: Negative.   Neurological: Negative.   Psychiatric/Behavioral: Positive for depression and suicidal ideas. Negative for hallucinations,  memory loss and substance abuse. The patient is nervous/anxious and has  insomnia.     Blood pressure 116/72, pulse 68, temperature 98.1 F (36.7 C), temperature source Oral, resp. rate 20, height 5' 3"  (1.6 m), weight 68 kg (150 lb), SpO2 94 %.Body mass index is 26.57 kg/m.  General Appearance: Disheveled  Eye Contact:  None  Speech:  Slow  Volume:  Decreased  Mood:  Irritable  Affect:  Flat  Thought Process:  Goal Directed  Orientation:  Full (Time, Place, and Person)  Thought Content:  Tangential  Suicidal Thoughts:  Yes.  with intent/plan  Homicidal Thoughts:  No  Memory:  Immediate;   Good Recent;   Fair Remote;   Fair  Judgement:  Impaired  Insight:  Lacking  Psychomotor Activity:  Decreased  Concentration:  Concentration: Poor  Recall:  Poor  Fund of Knowledge:  Poor  Language:  Fair  Akathisia:  No  Handed:  Right  AIMS (if indicated):     Assets:  Housing  ADL's:  Intact  Cognition:  WNL  Sleep:        Treatment Plan Summary: Daily contact with patient to assess and evaluate symptoms and progress in treatment, Medication management and Plan Patient with what appears to be depression or mixed bipolar who has been agitated labile and suicidal. Continues to voice suicidal ideation. Medically stable. Patient has orders in place to be admitted to the psychiatry ward. Continue to recommend admission to psychiatric ward. No acute change to medicine for today.  Disposition: Recommend psychiatric Inpatient admission when medically cleared. Supportive therapy provided about ongoing stressors.  Alethia Berthold, MD 12/02/2015 6:18 PM

## 2015-12-02 NOTE — ED Notes (Addendum)
Report called to Emmit [facility]ynn [facility]. Gynn said that since pt was given IM haldol that they could not accept them for 24 hrs and [facility].   [facility]. Rooms 1-5 are full and there is a 55 yo in the back. We would have to [facility]. No advantage to Pt. Consulting civil engineerCharge RN notified.

## 2015-12-02 NOTE — ED Notes (Signed)
BEHAVIORAL HEALTH ROUNDING Patient sleeping: Yes.   Patient alert and oriented: not applicable Behavior appropriate: Yes.  ; If no, describe:  Nutrition and fluids offered: No Toileting and hygiene offered: No Sitter present: not applicable Law enforcement present: Yes  

## 2015-12-02 NOTE — ED Notes (Signed)
Pt refusing to eat stating she is not hungry. I asked if she wanted me to order something else and she refused. I asked when she last ate pt stated 3 days. I asked if it was normal to not be hungry when someone had not eaten in 3 days. Pt responded that she had chron's. I asked if she had abd pain or diarrhea. She denied. MD notified of above.

## 2015-12-02 NOTE — ED Notes (Signed)
pt calmer s/p haldol. Pt sitting in corner refusing meds, food or drink. I spent 10-15 minutes talking to pt attempting to find out what meds she takes, what I could do to help her, what I could get for her to eat. All pt said was "I only want to kill others". Dr Scotty Court notified.

## 2015-12-02 NOTE — ED Notes (Signed)
BEHAVIORAL HEALTH ROUNDING Patient sleeping: No. Patient alert and oriented: no Behavior appropriate: No.; If no, describe: sleeping on floor, blaming Nutrition and fluids offered: Yes  Toileting and hygiene offered: Yes  Sitter present: yes Law enforcement present: Yes
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
## 2015-12-02 NOTE — ED Notes (Signed)
Pt requested and used phone.

## 2015-12-02 NOTE — ED Notes (Signed)
Patient comes to the door and states I just wet  the floor come" clean it up" ,I got clean material and towels and went in to help patient to get cleaned up patient states she does not want my help to get out ,then patient gets in bed with wet undergarments and pants ,then she throws wet undergarments to the floor, offered to give patient dry  Materials for herself and bed , she refuses. Notified nurse Lynita Lombard.

## 2015-12-02 NOTE — ED Notes (Signed)
BEHAVIORAL HEALTH ROUNDING Patient sleeping: No. Patient alert and oriented: no Behavior appropriate: No.; If no, describe:  Nutrition and fluids offered: Yes  Toileting and hygiene offered: Yes  Sitter present: yes Law enforcement present: Yes  

## 2015-12-03 ENCOUNTER — Inpatient Hospital Stay
Admission: EM | Admit: 2015-12-03 | Discharge: 2015-12-04 | DRG: 885 | Disposition: A | Discharge status: Home / Self Care | Payer: Medicare Other | Source: Intra-hospital | Attending: Psychiatry | Admitting: Psychiatry

## 2015-12-03 DIAGNOSIS — E063 Autoimmune thyroiditis: Secondary | ICD-10-CM | POA: Diagnosis present

## 2015-12-03 DIAGNOSIS — F17200 Nicotine dependence, unspecified, uncomplicated: Secondary | ICD-10-CM | POA: Diagnosis present

## 2015-12-03 DIAGNOSIS — E7800 Pure hypercholesterolemia, unspecified: Secondary | ICD-10-CM | POA: Diagnosis present

## 2015-12-03 DIAGNOSIS — G8929 Other chronic pain: Secondary | ICD-10-CM | POA: Diagnosis present

## 2015-12-03 DIAGNOSIS — T1491XA Suicide attempt, initial encounter: Secondary | ICD-10-CM | POA: Diagnosis present

## 2015-12-03 DIAGNOSIS — R45851 Suicidal ideations: Secondary | ICD-10-CM | POA: Diagnosis present

## 2015-12-03 DIAGNOSIS — M459 Ankylosing spondylitis of unspecified sites in spine: Secondary | ICD-10-CM | POA: Diagnosis present

## 2015-12-03 DIAGNOSIS — G4700 Insomnia, unspecified: Secondary | ICD-10-CM | POA: Diagnosis present

## 2015-12-03 DIAGNOSIS — Z7984 Long term (current) use of oral hypoglycemic drugs: Secondary | ICD-10-CM | POA: Diagnosis not present

## 2015-12-03 DIAGNOSIS — T383X2A Poisoning by insulin and oral hypoglycemic [antidiabetic] drugs, intentional self-harm, initial encounter: Secondary | ICD-10-CM | POA: Diagnosis present

## 2015-12-03 DIAGNOSIS — F332 Major depressive disorder, recurrent severe without psychotic features: Principal | ICD-10-CM | POA: Diagnosis present

## 2015-12-03 DIAGNOSIS — Z79899 Other long term (current) drug therapy: Secondary | ICD-10-CM

## 2015-12-03 DIAGNOSIS — E119 Type 2 diabetes mellitus without complications: Secondary | ICD-10-CM | POA: Diagnosis present

## 2015-12-03 DIAGNOSIS — D682 Hereditary deficiency of other clotting factors: Secondary | ICD-10-CM | POA: Diagnosis present

## 2015-12-03 LAB — GLUCOSE, CAPILLARY
GLUCOSE-CAPILLARY: 151 mg/dL — AB (ref 65–99)
GLUCOSE-CAPILLARY: 162 mg/dL — AB (ref 65–99)
Glucose-Capillary: 159 mg/dL — ABNORMAL HIGH (ref 65–99)
Glucose-Capillary: 157 mg/dL — ABNORMAL HIGH (ref 65–99)

## 2015-12-03 LAB — LIPID PANEL
Cholesterol: 289 mg/dL — ABNORMAL HIGH (ref 0–200)
HDL: 48 mg/dL (ref >40)
LDL Cholesterol: 212 mg/dL — ABNORMAL HIGH (ref 0–99)
Total CHOL/HDL Ratio: 6 RATIO
Triglycerides: 147 mg/dL (ref <150)
VLDL: 29 mg/dL (ref 0–40)

## 2015-12-03 LAB — TSH: TSH: 1.567 u[IU]/mL (ref 0.350–4.500)

## 2015-12-03 LAB — HEMOGLOBIN A1C
HEMOGLOBIN A1C: 9.1 % — AB (ref 4.8–5.6)
MEAN PLASMA GLUCOSE: 214 mg/dL

## 2015-12-03 MED ORDER — HYDROXYZINE HCL 25 MG PO TABS
25.0000 mg | ORAL_TABLET | Freq: Three times a day (TID) | ORAL | Status: DC | PRN
  Administered 2015-12-03: 25 mg via ORAL
  Filled 2015-12-03: qty 1

## 2015-12-03 MED ORDER — DULOXETINE HCL 30 MG PO CPEP
60.0000 mg | ORAL_CAPSULE | Freq: Every day | ORAL | Status: DC
  Administered 2015-12-04: 60 mg via ORAL
  Filled 2015-12-03: qty 2

## 2015-12-03 MED ORDER — METFORMIN HCL 500 MG PO TABS
500.0000 mg | ORAL_TABLET | Freq: Two times a day (BID) | ORAL | Status: DC
  Administered 2015-12-04: 500 mg via ORAL
  Filled 2015-12-03 (×2): qty 1

## 2015-12-03 MED ORDER — INSULIN ASPART 100 UNIT/ML ~~LOC~~ SOLN
0.0000 [IU] | Freq: Three times a day (TID) | SUBCUTANEOUS | Status: DC
  Filled 2015-12-03: qty 3

## 2015-12-03 MED ORDER — ACETAMINOPHEN 325 MG PO TABS
650.0000 mg | ORAL_TABLET | Freq: Four times a day (QID) | ORAL | Status: DC | PRN
  Administered 2015-12-03: 650 mg via ORAL
  Filled 2015-12-03: qty 2

## 2015-12-03 MED ORDER — TRAZODONE HCL 100 MG PO TABS: 100.0000 mg | ORAL_TABLET | Freq: Every evening | ORAL | Status: DC | PRN

## 2015-12-03 MED ORDER — LINAGLIPTIN 5 MG PO TABS
5.0000 mg | ORAL_TABLET | Freq: Every day | ORAL | Status: DC
  Filled 2015-12-03 (×2): qty 1

## 2015-12-03 MED ORDER — ALUM & MAG HYDROXIDE-SIMETH 200-200-20 MG/5ML PO SUSP: 30.0000 mL | ORAL | Status: DC | PRN

## 2015-12-03 MED ORDER — MAGNESIUM HYDROXIDE 400 MG/5ML PO SUSP: 30.0000 mL | Freq: Every day | ORAL | Status: DC | PRN

## 2015-12-03 NOTE — Progress Notes (Signed)
Tylenol 650 mg for 10/10 back pain and Vistaril 25 mg given at 0303 prior to bed.
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
## 2015-12-03 NOTE — Progress Notes (Signed)
Patient is labile and irritable.  Isolates to room.  No interaction with peers.  Refused medications.  No group attendance.

## 2015-12-03 NOTE — [facility] Suicide Risk Assessment (Signed)
[facility]   Nursing information obtained from:  Review of record Demographic factors:  Caucasian, Unemployed Current Mental Status:  Suicide plan, Belief that plan would result in death Loss Factors:  Legal issues Historical Factors:  Impulsivity Risk Reduction Factors:  Sense of responsibility to family  Total Time spent with patient: 1 hour Principal Problem: <principal problem not specified> Diagnosis:   Patient Active Problem List   Diagnosis Date Noted  . Severe recurrent major depression without psychotic features ([facility]) [F33.2] 12/03/2015  . Ankylosing spondylitis ([facility]) [M45.9] 12/03/2015  . Tobacco use disorder [F17.200] 12/03/2015  . Suicide attempt [T14.91XA] 12/01/2015  . Diabetes ([facility]) [E11.9] 12/01/2015   Subjective Data: overdose.  Continued Clinical Symptoms:  Alcohol Use Disorder Identification Test Final Score (AUDIT): 0 The "Alcohol Use Disorders Identification Test", Guidelines for Use in Primary Care, Second Edition.  World Science writerHealth Organization [facility](WHO). Score between 0-7:  no or low risk or alcohol related problems. Score between 8-15:  moderate risk of alcohol related problems. Score between 16-19:  high risk of alcohol related problems. Score 20 or above:  warrants further diagnostic evaluation for alcohol dependence and treatment.   CLINICAL FACTORS:   Depression:   Impulsivity Insomnia Chronic Pain Unstable or Poor Therapeutic Relationship Medical Diagnoses and Treatments/Surgeries   Musculoskeletal: Strength & Muscle Tone: within normal limits Gait & Station: normal Patient leans: N/A  Psychiatric Specialty Exam: Physical Exam  Nursing note and vitals reviewed.   Review of Systems  Musculoskeletal: Positive for back pain and myalgias.  Psychiatric/Behavioral: Positive for depression and suicidal ideas. The patient has insomnia.   All other systems reviewed and are negative.   Blood pressure 112/79, pulse 78,  temperature 98.8 F (37.1 C), temperature source Oral, resp. rate 18, height 5\' 2"  (1.575 m), weight 67.6 kg (149 lb), SpO2 97 %.Body mass index is 27.25 kg/m.  General Appearance: Disheveled  Eye Contact:  Minimal  Speech:  Pressured  Volume:  Increased  Mood:  Angry, Dysphoric and Irritable  Affect:  Congruent  Thought Process:  Goal Directed and Descriptions of Associations: Intact  Orientation:  Full (Time, Place, and Person)  Thought Content:  WDL  Suicidal Thoughts:  Yes.  with intent/plan  Homicidal Thoughts:  No  Memory:  Immediate;   Fair Recent;   Fair Remote;   Fair  Judgement:  Poor  Insight:  Lacking  Psychomotor Activity:  Decreased  Concentration:  Concentration: Fair and Attention Span: Fair  Recall:  FiservFair  Fund of Knowledge:  Fair  Language:  Fair  Akathisia:  No  Handed:  Right  AIMS (if indicated):     Assets:  ArchitectCommunication Skills Financial Resources/Insurance Housing Resilience Social Support  ADL's:  Intact  Cognition:  WNL  Sleep:  Number of Hours: 2      COGNITIVE FEATURES THAT CONTRIBUTE TO RISK:  None    SUICIDE RISK:   Moderate:  Frequent suicidal ideation with limited intensity, and duration, some specificity in terms of plans, no associated intent, good self-control, limited dysphoria/symptomatology, some risk factors present, and identifiable protective factors, including available and accessible social support.   PLAN OF CARE: Hospital admission, medication management, discharge planning.  Melanie Irwin is a 55 year old female with history of depression and chronic pain admitted after suicide attempt by overdose on general in the context of severe social stressors and inadequate pain control.  1. Suicidal ideation. On multiple occasions the patient made statement indicated that she is still suicidal. She would not answer  my questions directly. She is able to contract for safety in the hospital.  2. Mood. She has been taking Cymbalta in  the past. She is not interested in pharmacotherapy. We will try to offer mood stabilizer.  3. Diabetes. She takes metformin and general home. She is on ADA diet and sliding insulin but refuses treatment.  4. Insomnia. She slept 2 hours only will offer Seroquel.  5. Metabolic syndrome monitoring. Lipid profile shows elevated cholesterol, hemoglobin A1c is 9.1.  6. EKG. Pending.   7. Chronic pain. The patient reports that 9 months ago she was on morphine. This was discontinued and substituted with Cymbalta did not work for her.   8. Disposition. She will be discharged to home with her sister. She will follow up with a local provider. She will need a primary care provider refferal as well.   I certify that inpatient services furnished can reasonably be expected to improve the patient's condition.  Kristine Linea, MD 12/03/2015, 12:39 PM

## 2015-12-03 NOTE — Plan of Care (Signed)
Problem: Self-Concept: Goal: Ability to disclose and discuss suicidal ideas will improve Outcome: Not Progressing Patient is very hostile, irritable, angry coming in and in no mood to start accepting how to cope, denied suicidal ideations but slept in bed and not on the floor per handoff report.  Problem: Metabolic: Goal: Ability to maintain appropriate glucose levels will improve Outcome: Progressing Nursing will obtain ACHS CBG Assessment and follow hypo/hyper glycemic protocol

## 2015-12-03 NOTE — Progress Notes (Signed)
Recreation Therapy Notes  Date: 11.29.17 Time: 9:30 am Location: Craft Room  Group Topic: Self-esteem  Goal Area(s) Addresses:  Patient will write at least one positive trait about self. Patient will verbalize benefit of having healthy self-esteem.  Behavioral Response: Did not attend  Intervention: I Am  Activity: Patients were given a worksheet with the letter I on it and were instructed to write positive traits about themselves inside the letter.  Education: LRT educated patients on ways to increase their self-esteem.  Education Outcome: Patient did not attend group.   Clinical Observations/Feedback: Patient did not attend group.  Nia Nathaniel M, LRT/CTRS 12/03/2015 10:05 AM 

## 2015-12-03 NOTE — Tx Team (Signed)
Initial Treatment Plan 12/03/2015 4:44 AM [name] [id number]    PATIENT STRESSORS: Financial difficulties Legal issue   PATIENT STRENGTHS: Average or above average intelligence Capable of independent living Supportive family/friends   PATIENT IDENTIFIED PROBLEMS: Mood Instability  Probation Violation - Legal Issues ([place])  Suicidal Attempt by Januvia                 DISCHARGE CRITERIA:  Improved stabilization in mood, thinking, and/or behavior Motivation to continue treatment in a less acute level of care  PRELIMINARY DISCHARGE PLAN: Return to previous living arrangement  PATIENT/FAMILY INVOLVEMENT: This treatment plan has been presented to and reviewed with the patient, Melanie Irwin.  The patient have been given the opportunity to ask questions and make suggestions.  Cleotis Nipper, RN 12/03/2015, 4:44 AM

## 2015-12-03 NOTE — Plan of Care (Signed)
Problem: Coping: Goal: Ability to verbalize feelings will improve Outcome: Not Progressing Irritated, blaming others, not receptive to treatment regiement

## 2015-12-03 NOTE — Progress Notes (Signed)
Skin search done with [facility] RN. No contraband found. Patient argumentative and irritable throughout search. New set of scrubs and pamper given. Patient states she has a "neurologic problem" and crohn's which is why she needs the pamper.

## 2015-12-03 NOTE — Progress Notes (Signed)
Patient is to be admitted to [facility] by Dr. Toni Amend.  Attending Physician will be Dr. Ardyth Harps.   Patient has been assigned to room 316, by [employer].   ER staff is aware of the admission ( ER Sect.; Dr. Zenda Alpers, ER MD; Patient's Nurse & Patient Access). Per Doctor, general practice, pt. Is ready to come down to unit at this time.  Melane Windholz K. Sherlon Handing, LPC-A, [facility]  Counselor 12/03/2015 1:27 AM

## 2015-12-03 NOTE — Progress Notes (Signed)
Patient ID: Melanie Irwin, female   DOB: 08/03/1960, 55 y.o.   MRN: [id number] Melanie Irwin is a 55 yo WF admitted for MDD single episode w/o PF (Psychotic Features); "nobody comes to a Psych unit at this time unless they want to die.." Officer Clovis Riley, Engineer, materials, who wanded the patient prior to coming in to the unit, equally heard the patient. Angry, hostile, flat and irritable affect, vocabulary of profanity rich, patient is not happy to be here and has questionable EPS. 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 Denied taking Januvia with the intent to kill herself, I told my f**king probation officer that "they changed my diabetic medication to Januvia .... I took a fall for my 13 yo son, because I don't want him to go to jail because he was cooking meths at home ... I don't want to talk about this, I have a lawyer." Chart reviewed: Results for [name], [name] (MRN [id number]) as of 12/03/2015 03:45  Ref. Range 12/01/2015 15:06 12/01/2015 15:11  Mean Plasma Glucose Latest Units: mg/dL  021  Glucose Latest Ref Range: 65 - 99 mg/dL 117 (H)   Hemoglobin B5A Latest Ref Range: 4.8 - 5.6 %  9.1 (H)   Results for [name], [name] (MRN [id number]) as of 12/03/2015 03:45  Ref. Range 12/01/2015 15:07 12/01/2015 23:51 12/02/2015 08:55 12/02/2015 17:09 12/02/2015 21:17  Glucose-Capillary Latest Ref Range: 65 - 99 mg/dL 314 (H) 388 (H) 875 (H) 117 (H) 179 (H)

## 2015-12-03 NOTE — ED Notes (Signed)
Pt escorted to lower level behavioral unit, room 316 by Mabeline Caras and Burnice Logan, NT.  Pt transported via w/c.  Pt's belongings bag with Mabeline Caras to lower level.  Report called to Abiodun, Charity fundraiser.

## 2015-12-03 NOTE — H&P (Signed)
Psychiatric Admission Assessment Adult  Patient Identification: Melanie Irwin MRN:  [id number] Date of Evaluation:  12/03/2015 Chief Complaint:  major depression disorder Principal Diagnosis: <principal problem not specified> Diagnosis:   Patient Active Problem List   Diagnosis Date Noted  . Severe recurrent major depression without psychotic features (HCC) [F33.2] 12/03/2015  . Ankylosing spondylitis (HCC) [M45.9] 12/03/2015  . Tobacco use disorder [F17.200] 12/03/2015  . Suicide attempt [T14.91XA] 12/01/2015  . Diabetes (HCC) [E11.9] 12/01/2015   History of Present Illness:   Identifying data. Melanie Irwin is a 55 year old female with a history of depression and chronic pain.  Chief complaint. "I wanted to die."  History of present illness. Information was obtained from the patient and the chart. Apparently the patient has a long history of chronic pain. 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 She also believes that she has fibromyalgia. Apparently, 7 or 9 months ago the patient had been prescribed morphine by pain clinic. It is unclear why, but at some point narcotic painkillers were discontinued and the patient was offered Cymbalta. Somehow her sister who is a physician was instrumental in making this change. The patient reports that ever since she has been suffering terrible pain and has not been able to get out of bed or do anything. She became increasingly depressed with extremely poor sleep, decreased appetite, anhedonia, feeling of guilt and hopelessness worthlessness, poor energy and concentration, social isolation, crying spells, and suicidal thinking. She also became extremely irritable and volatile. She was brought to the emergency room after she overdosed on 10 tablets of Januvia with intention to die. The patient is ill and are irate. She is not really provide much information. She would not answer my questions  directly whether or not she is glad to be alive or whether or not she still suicidal. She does contract for safety in the hospital. So far she refused her morning medications including insulin. We do not have Januvia in the hospital and she refused our pharmacy substituted. She denies psychotic symptoms. She denies symptoms of excessive anxiety. She denies symptoms suggestive of bipolar mania. Her presentation is suggestive of dysphoric mania. She denies alcohol, illicit substance, or prescription pill abuse.  Past psychiatric history. She denies ever being admitted to a psychiatry hospital or substance abuse treatment. She denies prior suicide attempts. In the past she was treated with Lexapro for depression. More recently she was tried on some are mostly for pain with no improvement.  Family psychiatric history none reported.  Social history. 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 She is apparently disabled and has Medicare. She used to live in [place], [place] attempt in 9 months ago when she relocated back to stay with her sister who is a physician.  Total Time spent with patient: 1 hour  Is the patient at risk to self? Yes.    Has the patient been a risk to self in the past 6 months? No.  Has the patient been a risk to self within the distant past? No.  Is the patient a risk to others? No.  Has the patient been a risk to others in the past 6 months? No.  Has the patient been a risk to others within the distant past? No.   Prior Inpatient Therapy:   Prior Outpatient Therapy:    Alcohol Screening: 1. How often do you have a drink containing alcohol?: Never 2. How many  drinks containing alcohol do you have on a typical day when you are drinking?: 1 or 2 3. How often do you have six or more drinks on one occasion?: Never Preliminary Score: 0 4. How often during the last year have you found that you were not able to stop drinking once you had started?: Never 5. How often during  the last year have you failed to do what was normally expected from you becasue of drinking?: Never 6. How often during the last year have you needed a first drink in the morning to get yourself going after a heavy drinking session?: Never 7. How often during the last year have you had a feeling of guilt of remorse after drinking?: Never 9. Have you or someone else been injured as a result of your drinking?: No 10. Has a relative or friend or a doctor or another health worker been concerned about your drinking or suggested you cut down?: No Alcohol Use Disorder Identification Test Final Score (AUDIT): 0 Brief Intervention: AUDIT score less than 7 or less-screening does not suggest unhealthy drinking-brief intervention not indicated Substance Abuse History in the last 12 months:  Yes.   Consequences of Substance Abuse: Negative Previous Psychotropic Medications: Yes  Psychological Evaluations: No  Past Medical History:  Past Medical History:  Diagnosis Date  . Ankylosing spondylitis (HCC)   . Arthritis   . Birth defect    Spinal cord  . Factor II deficiency (HCC)   . Hashimoto's thyroiditis   . History of spinal cord injury   . Inflammatory arthritis   . Renal disorder   . Scoliosis   . Spinal stenosis     Past Surgical History:  Procedure Laterality Date  . CHOLECYSTECTOMY    . TUBAL LIGATION     Family History: History reviewed. No pertinent family history.  Tobacco Screening: Have you used any form of tobacco in the last 30 days? (Cigarettes, Smokeless Tobacco, Cigars, and/or Pipes): Yes Tobacco use, Select all that apply: 5 or more cigarettes per day Are you interested in Tobacco Cessation Medications?: No, patient refused Counseled patient on smoking cessation including recognizing danger situations, developing coping skills and basic information about quitting provided: Refused/Declined practical counseling (Nicotine patch 21 mg recommended) Social History:  History   Alcohol Use No     History  Drug use: Unknown    Additional Social History:                           Allergies:   Allergies  Allergen Reactions  . Penicillins Anaphylaxis  . Vancomycin Anaphylaxis  . Haldol [Haloperidol] Other (See Comments)    "DVT Induced ....Marland KitchenMarland Kitchen"   Lab Results:  Results for orders placed or performed during the hospital encounter of 12/03/15 (from the past 48 hour(s))  Lipid panel     Status: Abnormal   Collection Time: 12/03/15  6:49 AM  Result Value Ref Range   Cholesterol 289 (H) 0 - 200 mg/dL   Triglycerides 409 <811 mg/dL   HDL 48 >91 mg/dL   Total CHOL/HDL Ratio 6.0 RATIO   VLDL 29 0 - 40 mg/dL   LDL Cholesterol 478 (H) 0 - 99 mg/dL    Comment:        Total Cholesterol/HDL:CHD Risk Coronary Heart Disease Risk Table                     Men   Women  1/2 Average Risk  3.4   3.3  Average Risk       5.0   4.4  2 X Average Risk   9.6   7.1  3 X Average Risk  23.4   11.0        Use the calculated Patient Ratio above and the CHD Risk Table to determine the patient's CHD Risk.        ATP III CLASSIFICATION (LDL):  <100     mg/dL   Optimal  536-644  mg/dL   Near or Above                    Optimal  130-159  mg/dL   Borderline  034-742  mg/dL   High  >595     mg/dL   Very High   TSH     Status: None   Collection Time: 12/03/15  6:49 AM  Result Value Ref Range   TSH 1.567 0.350 - 4.500 uIU/mL    Comment: Performed by a 3rd Generation assay with a functional sensitivity of <=0.01 uIU/mL.  Glucose, capillary     Status: Abnormal   Collection Time: 12/03/15  6:49 AM  Result Value Ref Range   Glucose-Capillary 151 (H) 65 - 99 mg/dL   Comment 1 Notify RN   Glucose, capillary     Status: Abnormal   Collection Time: 12/03/15 11:52 AM  Result Value Ref Range   Glucose-Capillary 162 (H) 65 - 99 mg/dL   Comment 1 Notify RN    Comment 2 Document in Chart     Blood Alcohol level:  Lab Results  Component Value Date   ETH <5  12/01/2015    Metabolic Disorder Labs:  Lab Results  Component Value Date   HGBA1C 9.1 (H) 12/01/2015   MPG 214 12/01/2015   No results found for: PROLACTIN Lab Results  Component Value Date   CHOL 289 (H) 12/03/2015   TRIG 147 12/03/2015   HDL 48 12/03/2015   CHOLHDL 6.0 12/03/2015   VLDL 29 12/03/2015   LDLCALC 212 (H) 12/03/2015    Current Medications: Current Facility-Administered Medications  Medication Dose Route Frequency Provider Last Rate Last Dose  . acetaminophen (TYLENOL) tablet 650 mg  650 mg Oral Q6H PRN Audery Amel, MD   650 mg at 12/03/15 0303  . alum & mag hydroxide-simeth (MAALOX/MYLANTA) 200-200-20 MG/5ML suspension 30 mL  30 mL Oral Q4H PRN Audery Amel, MD      . hydrOXYzine (ATARAX/VISTARIL) tablet 25 mg  25 mg Oral TID PRN Audery Amel, MD   25 mg at 12/03/15 0303  . insulin aspart (novoLOG) injection 0-15 Units  0-15 Units Subcutaneous TID WC Audery Amel, MD      . linagliptin (TRADJENTA) tablet 5 mg  5 mg Oral Daily John T Clapacs, MD      . magnesium hydroxide (MILK OF MAGNESIA) suspension 30 mL  30 mL Oral Daily PRN Audery Amel, MD      . metFORMIN (GLUCOPHAGE) tablet 500 mg  500 mg Oral BID WC Audery Amel, MD      . traZODone (DESYREL) tablet 100 mg  100 mg Oral QHS PRN Audery Amel, MD       PTA Medications: Prescriptions Prior to Admission  Medication Sig Dispense Refill Last Dose  . gabapentin (NEURONTIN) 600 MG tablet Take 600 mg by mouth.   12/01/2015 at 0900  . tiZANidine (ZANAFLEX) 4 MG tablet Take 4 mg by mouth 3 (  three) times daily.   12/01/2015 at 0900  . amphetamine-dextroamphetamine (ADDERALL) 20 MG tablet Take 20 mg by mouth 2 (two) times daily. 2 tablets every morning, 1 tablet afternoon   12/01/2015 at 0900  . clonazePAM (KLONOPIN) 1 MG tablet Take 1 mg by mouth 3 (three) times daily as needed for anxiety.   12/01/2015 at 0900  . sitaGLIPtin (JANUVIA) 100 MG tablet Take 100 mg by mouth daily.   12/01/2015 at 0900     Musculoskeletal: Strength & Muscle Tone: within normal limits Gait & Station: normal Patient leans: N/A  Psychiatric Specialty Exam: Physical Exam  Nursing note and vitals reviewed. Constitutional: She is oriented to person, place, and time. She appears well-developed and well-nourished.  HENT:  Head: Normocephalic and atraumatic.  Eyes: Conjunctivae and EOM are normal. Pupils are equal, round, and reactive to light.  Neck: Normal range of motion. Neck supple.  Cardiovascular: Normal rate, regular rhythm and normal heart sounds.   Respiratory: Effort normal and breath sounds normal.  GI: Soft. Bowel sounds are normal.  Musculoskeletal: Normal range of motion.  Neurological: She is alert and oriented to person, place, and time.  Skin: Skin is warm and dry.    Review of Systems  Musculoskeletal: Positive for back pain and myalgias.  Psychiatric/Behavioral: Positive for depression and suicidal ideas. The patient has insomnia.   All other systems reviewed and are negative.   Blood pressure 112/79, pulse 78, temperature 98.8 F (37.1 C), temperature source Oral, resp. rate 18, height 5\' 2"  (1.575 m), weight 67.6 kg (149 lb), SpO2 97 %.Body mass index is 27.25 kg/m.  See SRA.                                                  Sleep:  Number of Hours: 2    Treatment Plan Summary: Daily contact with patient to assess and evaluate symptoms and progress in treatment and Medication management   Melanie Irwin is a 55 year old female with history of depression and chronic pain admitted after suicide attempt by overdose on general in the context of severe social stressors and inadequate pain control.  1. Suicidal ideation. On multiple occasions the patient made statement indicated that she is still suicidal. She would not answer my questions directly. She is able to contract for safety in the hospital.  2. Mood. She has been taking Cymbalta in the past. She is not  interested in pharmacotherapy. We will try to offer mood stabilizer.  3. Diabetes. She takes metformin and general home. She is on ADA diet and sliding insulin but refuses treatment.  4. Insomnia. She slept 2 hours only will offer Seroquel.  5. Metabolic syndrome monitoring. Lipid profile shows elevated cholesterol, hemoglobin A1c is 9.1.  6. EKG. Pending.   7. Chronic pain. The patient reports that 9 months ago she was on morphine. This was discontinued and substituted with Cymbalta did not work for her.   8. Disposition. She will be discharged to home with her sister. She will follow up with a local provider. She will need a primary care provider refferal as well.    Observation Level/Precautions:  15 minute checks  Laboratory:  CBC Chemistry Profile UDS UA  Psychotherapy:    Medications:    Consultations:    Discharge Concerns:    Estimated LOS:  Other:  Physician Treatment Plan for Primary Diagnosis: <principal problem not specified> Long Term Goal(s): Improvement in symptoms so as ready for discharge  Short Term Goals: Ability to identify changes in lifestyle to reduce recurrence of condition will improve, Ability to verbalize feelings will improve, Ability to disclose and discuss suicidal ideas, Ability to demonstrate self-control will improve, Ability to identify and develop effective coping behaviors will improve, Ability to maintain clinical measurements within normal limits will improve and Compliance with prescribed medications will improve  Physician Treatment Plan for Secondary Diagnosis: Active Problems:   Suicide attempt   Diabetes (HCC)   Severe recurrent major depression without psychotic features (HCC)   Ankylosing spondylitis (HCC)   Tobacco use disorder  Long Term Goal(s): NA  Short Term Goals: NA  I certify that inpatient services furnished can reasonably be expected to improve the patient's condition.    Kristine Linea, MD 11/29/201712:48  PM

## 2015-12-03 NOTE — [facility] Group Notes (Signed)
[facility] Group Notes:  (Nursing/MHT/Case Management/Adjunct)  Date:  12/03/2015  Time:  4:15 PM  Type of Therapy:  Group Therapy  Participation Level:  Did Not Attend   Ixchel Duck De'Chelle Jaiquan Temme 12/03/2015, 4:15 PM

## 2015-12-03 NOTE — Plan of Care (Signed)
Problem: Health Behavior/Discharge Planning: Goal: Compliance with therapeutic regimen will improve Outcome: Not Progressing Refused medications, no group attendance

## 2015-12-04 LAB — VITAMIN D 25 HYDROXY (VIT D DEFICIENCY, FRACTURES): Vit D, 25-Hydroxy: 16 ng/mL — ABNORMAL LOW (ref 30.0–100.0)

## 2015-12-04 LAB — GLUCOSE, CAPILLARY
GLUCOSE-CAPILLARY: 154 mg/dL — AB (ref 65–99)
GLUCOSE-CAPILLARY: 194 mg/dL — AB (ref 65–99)

## 2015-12-04 LAB — HEMOGLOBIN A1C
HEMOGLOBIN A1C: 8.4 % — AB (ref 4.8–5.6)
MEAN PLASMA GLUCOSE: 194 mg/dL

## 2015-12-04 MED ORDER — DULOXETINE HCL 60 MG PO CPEP: 60.0000 mg | ORAL_CAPSULE | Freq: Every day | ORAL | 1 refills | Status: DC

## 2015-12-04 MED ORDER — METFORMIN HCL 500 MG PO TABS: 500.0000 mg | ORAL_TABLET | Freq: Two times a day (BID) | ORAL | 1 refills | Status: AC

## 2015-12-04 NOTE — [facility] Counselor (Signed)
Patient is irritable, labile, and refusing medications. Patient is unable to complete assessment at this time. CSW will attempt assessment later.  Jodye Scali G. Garnette Czech MSW, LCSWA 12/04/2015 12:01 PM

## 2015-12-04 NOTE — Progress Notes (Signed)
D: Observed pt in room laying on bed. Patient alert and oriented x4. Patient denies SI/HI/AVH. Pt affect is irritable. Pt was very short with writer this evening. Pt forwarded little. Pt denied feeling depressed or anxious.  A: Offered active listening and support. Provided therapeutic communication.  R: Pt pleasant and cooperative. Pt had no medications scheduled this evening. Pt isolated to room all evening. Will continue Q15 min. checks. Safety maintained.

## 2015-12-04 NOTE — Progress Notes (Signed)
Denies SI/HI/AVH.  Irritable. Refused insulin and tradjenta.  Discharge instructions given, verbalized understanding.  Prescriptions given and personal belongings returned. Escorted off unit by this Clinical research associate to main entrance to travel home with family member.

## 2015-12-04 NOTE — [facility] Group Notes (Signed)
[facility] LCSW Group Therapy  12/04/2015 3:30 PM  Type of Therapy:  Group Therapy  Participation Level:  Patient did not attend group. CSW invited patient to group.   Summary of Progress/Problems: Balance in life: Patients will discuss the concept of balance and how it looks and feels to be unbalanced. Pt will identify areas in their life that is unbalanced and ways to become more balanced. They discussed what aspects in their lives has influenced their self care. Patients also discussed self care in the areas of self regulation/control, hygiene/appearance, sleep/relaxation, healthy leisure, healthy eating habits, exercise, inner peace/spirituality, self improvement, sobriety, and health management. They were challenged to identify changes that are needed in order to improve self care.   Kahlie Deutscher G. Garnette Czech MSW, LCSWA 12/04/2015, 3:30 PM

## 2015-12-04 NOTE — Discharge Summary (Signed)
Physician Discharge Summary Note  Patient:  Melanie Irwin is an 55 y.o., female MRN:  [id number] DOB:  03-13-1960 Patient phone:  [phone] (home)  Patient address:   [address] [place] [id number],  Total Time spent with patient: 30 minutes  Date of Admission:  12/03/2015 Date of Discharge: 12/04/2015  Reason for Admission:  Overdose.  Identifying data. Melanie Irwin is a 55 year old female with a history of depression and chronic pain.  Chief complaint. "I wanted to die."  History of present illness. Information was obtained from the patient and the chart. Apparently the patient has a long history of chronic pain. 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 She also believes that she has fibromyalgia. Apparently, 7 or 9 months ago the patient had been prescribed morphine by pain clinic. It is unclear why, but at some point narcotic painkillers were discontinued and the patient was offered Cymbalta. Somehow her sister who is a physician was instrumental in making this change. The patient reports that ever since she has been suffering terrible pain and has not been able to get out of bed or do anything. She became increasingly depressed with extremely poor sleep, decreased appetite, anhedonia, feeling of guilt and hopelessness worthlessness, poor energy and concentration, social isolation, crying spells, and suicidal thinking. She also became extremely irritable and volatile. She was brought to the emergency room after she overdosed on 10 tablets of Januvia with intention to die. The patient is ill and are irate. She is not really provide much information. She would not answer my questions directly whether or not she is glad to be alive or whether or not she still suicidal. She does contract for safety in the hospital. So far she refused her morning medications including insulin. We do not have Januvia in the hospital and she  refused our pharmacy substituted. She denies psychotic symptoms. She denies symptoms of excessive anxiety. She denies symptoms suggestive of bipolar mania. Her presentation is suggestive of dysphoric mania. She denies alcohol, illicit substance, or prescription pill abuse.  Past psychiatric history. She denies ever being admitted to a psychiatry hospital or substance abuse treatment. She denies prior suicide attempts. In the past she was treated with Lexapro for depression. More recently she was tried on some are mostly for pain with no improvement.  Family psychiatric history none reported.  Social history. 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 She is apparently disabled and has Medicare. She used to live in [place] attempt in 9 months ago when she relocated back to stay with her sister who is a physician. She has a teenage son with autism.   Principal Problem: Severe recurrent major depression without psychotic features [facility](HCC) Discharge Diagnoses: Patient Active Problem List   Diagnosis Date Noted  . Severe recurrent major depression without psychotic features (HCC) [F33.2] 12/03/2015  . Ankylosing spondylitis (HCC) [M45.9] 12/03/2015  . Tobacco use disorder [F17.200] 12/03/2015  . Suicide attempt [T14.91XA] 12/01/2015  . Diabetes (HCC) [E11.9] 12/01/2015   Past Medical History:  Past Medical History:  Diagnosis Date  . Ankylosing spondylitis (HCC)   . Arthritis   . Birth defect    Spinal cord  . Factor II deficiency (HCC)   . Hashimoto's thyroiditis   . History of spinal cord injury   . Inflammatory arthritis   . Renal disorder   . Scoliosis   . Spinal stenosis  Past Surgical History:  Procedure Laterality Date  . CHOLECYSTECTOMY    . TUBAL LIGATION     Family History: History reviewed. No pertinent family history.  Social History:  History  Alcohol Use No     History  Drug use: Unknown    Social History   Social History  . Marital  status: Divorced    Spouse name: N/A  . Number of children: N/A  . Years of education: N/A   Social History Main Topics  . Smoking status: Current Every Day Smoker  . Smokeless tobacco: Never Used  . Alcohol use No  . Drug use: Unknown  . Sexual activity: Not Asked   Other Topics Concern  . None   Social History Narrative  . None    Hospital Course:    Melanie Irwin is a 55 year old female with history of depression and chronic pain admitted after suicide attempt by overdose on general in the context of severe social stressors and inadequate pain control.  1. Suicidal ideation. Resolved. The patient adamantly denies any thoughts, intention, or plans to hurt herself or others. She is able to contract for safety. She is a loving mother and sister.  2. Mood. She was restarted on Cymbalta for depression and pain.   3. Diabetes. We continued metformin. She will restart Januvia at home.   4. Insomnia. She refused sleeping aid.   5. Metabolic syndrome monitoring. Lipid profile shows elevated cholesterol, hemoglobin A1c is 9.1.  6. Chronic pain. The patient reports that 9 months ago she was on morphine. This was discontinued and substituted with Cymbalta.    7. Disposition. She was discharged to home with her sister. She will follow up with [facility] for psychitric need and with dr. Lewis Moccasin as her PCP.    Physical Findings: AIMS: Facial and Oral Movements Muscles of Facial Expression: Mild Lips and Perioral Area: Mild Jaw: Mild Tongue: Mild,Extremity Movements Upper (arms, wrists, hands, fingers): None, normal Lower (legs, knees, ankles, toes): None, normal, Trunk Movements Neck, shoulders, hips: None, normal, Overall Severity Severity of abnormal movements (highest score from questions above): Mild Incapacitation due to abnormal movements: None, normal Patient's awareness of abnormal movements (rate only patient's report): Aware, mild distress, Dental Status Current problems  with teeth and/or dentures?: Yes Does patient usually wear dentures?: Yes  CIWA:    COWS:     Musculoskeletal: Strength & Muscle Tone: within normal limits Gait & Station: normal Patient leans: N/A  Psychiatric Specialty Exam: Physical Exam  Nursing note and vitals reviewed.   Review of Systems  Musculoskeletal: Positive for back pain.  All other systems reviewed and are negative.   Blood pressure 113/72, pulse 75, temperature 98.2 F (36.8 C), temperature source Oral, resp. rate 18, height 5\' 2"  (1.575 m), weight 67.6 kg (149 lb), SpO2 97 %.Body mass index is 27.25 kg/m.  General Appearance: Casual  Eye Contact:  Good  Speech:  Clear and Coherent  Volume:  Normal  Mood:  Euthymic  Affect:  Appropriate  Thought Process:  Goal Directed and Descriptions of Associations: Intact  Orientation:  Full (Time, Place, and Person)  Thought Content:  WDL  Suicidal Thoughts:  No  Homicidal Thoughts:  No  Memory:  Immediate;   Fair Recent;   Fair Remote;   Fair  Judgement:  Impaired  Insight:  Shallow  Psychomotor Activity:  Normal  Concentration:  Concentration: Fair and Attention Span: Fair  Recall:  [employer] of Knowledge:  Fair  Language:  Fair  Akathisia:  No  Handed:  Right  AIMS (if indicated):     Assets:  Communication Skills Desire for Improvement Financial Resources/Insurance Housing Resilience Social Support Transportation  ADL's:  Intact  Cognition:  WNL  Sleep:  Number of Hours: 9     Have you used any form of tobacco in the last 30 days? (Cigarettes, Smokeless Tobacco, Cigars, and/or Pipes): Yes  Has this patient used any form of tobacco in the last 30 days? (Cigarettes, Smokeless Tobacco, Cigars, and/or Pipes) Yes, Yes, A prescription for an FDA-approved tobacco cessation medication was offered at discharge and the patient refused  Blood Alcohol level:  Lab Results  Component Value Date   [facility] <5 12/01/2015    Metabolic Disorder Labs:  Lab Results   Component Value Date   HGBA1C 8.4 (H) 12/03/2015   MPG 194 12/03/2015   MPG 214 12/01/2015   No results found for: PROLACTIN Lab Results  Component Value Date   CHOL 289 (H) 12/03/2015   TRIG 147 12/03/2015   HDL 48 12/03/2015   CHOLHDL 6.0 12/03/2015   VLDL 29 12/03/2015   LDLCALC 212 (H) 12/03/2015    See Psychiatric Specialty Exam and Suicide Risk Assessment completed by Attending Physician prior to discharge.  Discharge destination:  Home  Is patient on multiple antipsychotic therapies at discharge:  No   Has Patient had three or more failed trials of antipsychotic monotherapy by history:  No  Recommended Plan for Multiple Antipsychotic Therapies: NA  Discharge Instructions    Diet - low sodium heart healthy    Complete by:  As directed    Increase activity slowly    Complete by:  As directed        Medication List    STOP taking these medications   amphetamine-dextroamphetamine 20 MG tablet Commonly known as:  ADDERALL   clonazePAM 1 MG tablet Commonly known as:  KLONOPIN     TAKE these medications     Indication  DULoxetine 60 MG capsule Commonly known as:  CYMBALTA Take 1 capsule (60 mg total) by mouth daily. Start taking on:  12/05/2015  Indication:  Fibromyalgia Syndrome, Major Depressive Disorder   gabapentin 600 MG tablet Commonly known as:  NEURONTIN Take 600 mg by mouth.  Indication:  Nerve Pain, Neurogenic Pain   metFORMIN 500 MG tablet Commonly known as:  GLUCOPHAGE Take 1 tablet (500 mg total) by mouth 2 (two) times daily with a meal.  Indication:  Type 2 Diabetes   sitaGLIPtin 100 MG tablet Commonly known as:  JANUVIA Take 100 mg by mouth daily.  Indication:  Type 2 Diabetes   tiZANidine 4 MG tablet Commonly known as:  ZANAFLEX Take 4 mg by mouth 3 (three) times daily.  Indication:  Muscle Spasticity        Follow-up recommendations:  Activity:  as tolerated. Diet:  low sodium heart healthy ADA diet. Other:  Keep  follow-up appointments.  Comments:    Signed: Kristine Linea, MD 12/04/2015, 12:34 PM

## 2015-12-04 NOTE — [facility] Suicide Risk Assessment (Signed)
[facility] INPATIENT:  Family/Significant Other Suicide Prevention Education  Suicide Prevention Education:  Patient Refusal for Family/Significant Other Suicide Prevention Education: The patient Melanie Irwin has refused to provide written consent for family/significant other to be provided Family/Significant Other Suicide Prevention Education during admission and/or prior to discharge.  Physician notified.  Pt refused SPE from then CSW.   Dorothe PeaJonathan F Nikyla Navedo 12/04/2015, 12:49 PM

## 2015-12-04 NOTE — [facility] Group Notes (Signed)
[facility] Group Notes:  (Nursing/MHT/Case Management/Adjunct)  Date:  12/04/2015  Time:  4:04 PM  Type of Therapy:  Psychoeducational Skills  Participation Level:  Did Not Attend  Gaetana Kawahara C Nevah Dalal 12/04/2015, 4:04 PM 

## 2015-12-04 NOTE — Progress Notes (Signed)
  [facility] Adult Case Management Discharge Plan :  Will you be returning to the same living situation after discharge:  No. Pt will be discharging to her sister's house in [place] to live At discharge, do you have transportation home?: Yes,  pt will take a taxi Do you have the ability to pay for your medications: Yes,  pt will be provided with prescriptions at discharge  Release of information consent forms completed and in the chart;  Patient's signature needed at discharge.  Patient to Follow up at: Follow-up Information    [facility] Follow up.   Why:  Please arrive to the walk-in clinic between the hours of 8am-2:30pm for an assessment for medication management and therapy.  Arrive as early as possible for prompt service.  Please call Unk Pinto at [phone] for questions and assistance.  Contact information: [facility] [address] [place] [address] Ph: [phone] Fax: [phone]          Next level of care provider has access to [facility] Link:no  Safety Planning and Suicide Prevention discussed: Yes,  completed with pt  Have you used any form of tobacco in the last 30 days? (Cigarettes, Smokeless Tobacco, Cigars, and/or Pipes): Yes  Has patient been referred to the Quitline?: Patient refused referral  Patient has been referred for addiction treatment: Pt. refused referral  Melanie Irwin 12/04/2015, 12:53 PM

## 2015-12-04 NOTE — Progress Notes (Signed)
Recreation Therapy Notes  Date: 11.30.17 Time: 9:30 am Location: Craft Room  Group Topic: Leisure Education  Goal Area(s) Addresses:  Patient will identify activities for each letter of the alphabet. Patient will verbalize ability to integrate positive leisure into life post d/c. Patient will verbalize ability to use leisure as a coping skill.  Behavioral Response: Did not attend   Intervention: Leisure Alphabet  Activity: Patients were given a Leisure Alphabet worksheet and were instructed to write healthy leisure activities for each letter of the alphabet.  Education: LRT educated patients on what they need to participate in leisure.  Education Outcome: Patient did not attend group.   Clinical Observations/Feedback: Patient did not attend group.  Lexani Corona M, LRT/CTRS 12/04/2015 10:18 AM 

## 2015-12-04 NOTE — [facility] Counselor (Signed)
Adult Comprehensive Assessment  Patient ID: Melanie Irwin, female   DOB: [birth year], 55 y.o.   MRN: [id number]  Information Source: Information source: Patient  Current Stressors:  Educational / Learning stressors: Pt denied Employment / Job issues: Pt denied Family Relationships: Pt's son has been in jail, but is scheduled to be discharged from jail soon. Financial / Lack of resources (include bankruptcy): Pt denied Housing / Lack of housing: Pt denied Physical health (include injuries & life threatening diseases): Pt denied Social relationships: Pt denied Substance abuse: Pt denied Bereavement / Loss: Pt reports her brother passed away one year ago and the pt's father is dying of lung cancer and is not expected to live past six months  Living/Environment/Situation:  Living Arrangements: Other relatives Living conditions (as described by patient or guardian): Pt lives with her sister who is a doctor/ob/gyn in [place], her sister's husband and the pt's son How long has patient lived in current situation?: Three months What is atmosphere in current home: Comfortable, Paramedic, Supportive (Pt reports she does not like her brother-in-law)  Family History:  Marital status: Divorced Divorced, when?: Since 1990's Does patient have children?: Yes How many children?: 5 How is patient's relationship with their children?: Good "mostly", per the pt  Childhood History:  By whom was/is the patient raised?: Mother/father and step-parent Description of patient's relationship with caregiver when they were a child: Good relationship Patient's description of current relationship with people who raised him/her: step-father is deceased, pt "gets along with" her mother Does patient have siblings?: Yes Number of Siblings: 1 Description of patient's current relationship with siblings: Good with sister, brother is deceased Did patient suffer any verbal/emotional/physical/sexual abuse as a child?:  No Did patient suffer from severe childhood neglect?: No Has patient ever been sexually abused/assaulted/raped as an adolescent or adult?: No Was the patient ever a victim of a crime or a disaster?: No Witnessed domestic violence?: No Has patient been effected by domestic violence as an adult?: No  Education:  Highest grade of school patient has completed: Sports administrator disability?: No  Employment/Work Situation:   Employment situation: On disability Why is patient on disability: Pt reports "my back, birth defects, spinal cord injury, alot, a whole lot", per the pt How long has patient been on disability: 3-4 years What is the longest time patient has a held a job?: Pt reports 16 years 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 Has patient ever been in the [employer]?: No  Financial Resources:   Surveyor, quantity resources: Insurance claims handler, Medicare  Alcohol/Substance Abuse:   What has been your use of drugs/alcohol within the last 12 months?: Pt denies the use of alcohol and/or other substances If attempted suicide, did drugs/alcohol play a role in this?: No Alcohol/Substance Abuse Treatment Hx: Denies past history Has alcohol/substance abuse ever caused legal problems?: No  Social Support System:   Patient's Community Support System: Good Describe Community Support System: Sister,  Type of faith/religion: Catholic How does patient's faith help to cope with current illness?: Pt reports "not really"  Leisure/Recreation:   Leisure and Hobbies: Pt reports, shopping"  Strengths/Needs:   What things does the patient do well?: Pt reports, I don't know". In what areas does patient struggle / problems for patient: Pt reports "nothing"  Discharge Plan:   Does patient have access to transportation?: Yes Will patient be returning to same living situation after discharge?: Yes Currently receiving community mental health services: Yes (From Whom) If no, would patient like referral  for services when discharged?: Yes (What county?) ([place], [facility]) Does patient have financial barriers related to discharge medications?: No  Summary/Recommendations:   Summary and Recommendations (to be completed by the evaluator): Patient presented to the hospital and was admitted for an overdose with pills.  Pt's primary diagnosis is     Severe recurrent major depression without psychotic features (HCC) F33.2.  Pt reports primary triggers for admission were an inability to read the directions on her prescription bottle due to misplacing her glasses and a conflict with family members.  Pt reports her primary stressor is the pt's son being jailed.  Pt now denies SI/HI/AVH.  [place]  Pt lists supports in the community as the pt's sister and the pt's family.  Patient will benefit from crisis stabilization, medication evaluation, group therapy, and psycho education in addition to case management for discharge planning. Patient and CSW reviewed pt's identified goals and treatment plan. Pt verbalized understanding and agreed to treatment plan.  At discharge it is recommended that patient remain compliant with established plan and continue treatment.  Dorothe PeaJonathan F Rockelle Heuerman. 12/04/2015

## 2015-12-04 NOTE — Plan of Care (Signed)
Problem: Activity: Goal: Interest or engagement in leisure activities will improve Outcome: Not Progressing Pt isolated to room all evening.    

## 2015-12-04 NOTE — Tx Team (Signed)
Interdisciplinary Treatment and Diagnostic Plan Update  12/04/2015 Time of Session: 10:30 Melanie Irwin MRN: [id number]  Principal Diagnosis: Severe recurrent major depression without psychotic features [facility](HCC)  Secondary Diagnoses: Principal Problem:   Severe recurrent major depression without psychotic features (HCC) Active Problems:   Suicide attempt   Diabetes (HCC)   Ankylosing spondylitis (HCC)   Tobacco use disorder   Current Medications:  Current Facility-Administered Medications  Medication Dose Route Frequency Provider Last Rate Last Dose  . acetaminophen (TYLENOL) tablet 650 mg  650 mg Oral Q6H PRN Audery AmelJohn T Clapacs, MD   650 mg at 12/03/15 0303  . alum & mag hydroxide-simeth (MAALOX/MYLANTA) 200-200-20 MG/5ML suspension 30 mL  30 mL Oral Q4H PRN Audery AmelJohn T Clapacs, MD      . DULoxetine (CYMBALTA) DR capsule 60 mg  60 mg Oral Daily Jolanta B Pucilowska, MD   60 mg at 12/04/15 0848  . hydrOXYzine (ATARAX/VISTARIL) tablet 25 mg  25 mg Oral TID PRN Audery AmelJohn T Clapacs, MD   25 mg at 12/03/15 0303  . insulin aspart (novoLOG) injection 0-15 Units  0-15 Units Subcutaneous TID WC Audery AmelJohn T Clapacs, MD      . linagliptin (TRADJENTA) tablet 5 mg  5 mg Oral Daily John T Clapacs, MD      . magnesium hydroxide (MILK OF MAGNESIA) suspension 30 mL  30 mL Oral Daily PRN Audery AmelJohn T Clapacs, MD      . metFORMIN (GLUCOPHAGE) tablet 500 mg  500 mg Oral BID WC Audery AmelJohn T Clapacs, MD   500 mg at 12/04/15 0848  . traZODone (DESYREL) tablet 100 mg  100 mg Oral QHS PRN Audery AmelJohn T Clapacs, MD       PTA Medications: Prescriptions Prior to Admission  Medication Sig Dispense Refill Last Dose  . gabapentin (NEURONTIN) 600 MG tablet Take 600 mg by mouth.   12/01/2015 at 0900  . tiZANidine (ZANAFLEX) 4 MG tablet Take 4 mg by mouth 3 (three) times daily.   12/01/2015 at 0900  . amphetamine-dextroamphetamine (ADDERALL) 20 MG tablet Take 20 mg by mouth 2 (two) times daily. 2 tablets every morning, 1 tablet afternoon   12/01/2015  at 0900  . clonazePAM (KLONOPIN) 1 MG tablet Take 1 mg by mouth 3 (three) times daily as needed for anxiety.   12/01/2015 at 0900  . sitaGLIPtin (JANUVIA) 100 MG tablet Take 100 mg by mouth daily.   12/01/2015 at 0900    Patient Stressors: Financial difficulties Legal issue  Patient Strengths: Average or above average intelligence Capable of independent living Supportive family/friends  Treatment Modalities: Medication Management, Group therapy, Case management,  1 to 1 session with clinician, Psychoeducation, Recreational therapy.   Physician Treatment Plan for Primary Diagnosis: Severe recurrent major depression without psychotic features (HCC) Long Term Goal(s): Improvement in symptoms so as ready for discharge NA   Short Term Goals: Ability to identify changes in lifestyle to reduce recurrence of condition will improve Ability to verbalize feelings will improve Ability to disclose and discuss suicidal ideas Ability to demonstrate self-control will improve Ability to identify and develop effective coping behaviors will improve Ability to maintain clinical measurements within normal limits will improve Compliance with prescribed medications will improve NA  Medication Management: Evaluate patient's response, side effects, and tolerance of medication regimen.  Therapeutic Interventions: 1 to 1 sessions, Unit Group sessions and Medication administration.  Evaluation of Outcomes: Adequate for discharge  Physician Treatment Plan for Secondary Diagnosis: Principal Problem:   Severe recurrent major depression without psychotic features (HCC)  Active Problems:   Suicide attempt   Diabetes (HCC)   Ankylosing spondylitis (HCC)   Tobacco use disorder  Long Term Goal(s): Improvement in symptoms so as ready for discharge NA   Short Term Goals: Ability to identify changes in lifestyle to reduce recurrence of condition will improve Ability to verbalize feelings will improve Ability  to disclose and discuss suicidal ideas Ability to demonstrate self-control will improve Ability to identify and develop effective coping behaviors will improve Ability to maintain clinical measurements within normal limits will improve Compliance with prescribed medications will improve NA     Medication Management: Evaluate patient's response, side effects, and tolerance of medication regimen.  Therapeutic Interventions: 1 to 1 sessions, Unit Group sessions and Medication administration.  Evaluation of Outcomes: Adequate for discharge   RN Treatment Plan for Primary Diagnosis: Severe recurrent major depression without psychotic features (HCC) Long Term Goal(s): Knowledge of disease and therapeutic regimen to maintain health will improve  Short Term Goals: Ability to remain free from injury will improve, Ability to verbalize frustration and anger appropriately will improve, Ability to demonstrate self-control, Ability to identify and develop effective coping behaviors will improve and Compliance with prescribed medications will improve  Medication Management: RN will administer medications as ordered by provider, will assess and evaluate patient's response and provide education to patient for prescribed medication. RN will report any adverse and/or side effects to prescribing provider.  Therapeutic Interventions: 1 on 1 counseling sessions, Psychoeducation, Medication administration, Evaluate responses to treatment, Monitor vital signs and CBGs as ordered, Perform/monitor CIWA, COWS, AIMS and Fall Risk screenings as ordered, Perform wound care treatments as ordered.  Evaluation of Outcomes: Adequate for discharge   LCSW Treatment Plan for Primary Diagnosis: Severe recurrent major depression without psychotic features (HCC) Long Term Goal(s): Safe transition to appropriate next level of care at discharge, Engage patient in therapeutic group addressing interpersonal concerns.  Short Term  Goals: Engage patient in aftercare planning with referrals and resources, Increase social support, Increase ability to appropriately verbalize feelings, Increase emotional regulation, Facilitate acceptance of mental health diagnosis and concerns, Facilitate patient progression through stages of change regarding substance use diagnoses and concerns, Identify triggers associated with mental health/substance abuse issues and Increase skills for wellness and recovery  Therapeutic Interventions: Assess for all discharge needs, 1 to 1 time with Social worker, Explore available resources and support systems, Assess for adequacy in community support network, Educate family and significant other(s) on suicide prevention, Complete Psychosocial Assessment, Interpersonal group therapy.  Evaluation of Outcomes: Adequate for discharge    Progress in Treatment: Attending groups: Yes. Participating in groups: Yes. Taking medication as prescribed: Yes. Toleration medication: Yes. Family/Significant other contact made: No, will contact:  pt's family with permision from the pt, the pt's sister is Donalynn Furlong [phone] Patient understands diagnosis: Yes. Discussing patient identified problems/goals with staff: Yes. Medical problems stabilized or resolved: Yes. Denies suicidal/homicidal ideation: Yes. Issues/concerns per patient self-inventory: No. Other: none listed  New problem(s) identified: No, Describe:  none listed  New Short Term/Long Term Goal(s): none  Discharge Plan or Barriers: Pt will Pt will discharge home to [place] to live with the pt's sister and wilkl follow up with [facility] for for medication management, substance abuse treatment and therapy.     Reason for Continuation of Hospitalization: Anxiety Depression Suicidal ideation Withdrawal symptoms  Estimated Date of discharge: 12/04/15   Attendees: Patient: Melanie Irwin 12/04/2015 10:49 AM  Physician: Dr. Jennet Maduro, MD 12/04/2015  10:49 AM  Nursing: Leonia Reader,  RN 12/04/2015 10:49 AM  RN Care Manager: 12/04/2015 10:49 AM  Social Worker: Dorothe Pea. Roylene Reason, LCAS    12/04/2015 10:49 AM  Recreational Therapist: Hershal Coria, LRT 12/04/2015 10:49 AM  Other:  12/04/2015 10:49 AM  Other:  12/04/2015 10:49 AM  Other: 12/04/2015 10:49 AM    Scribe for Treatment Team: Dorothe Pea Rudy Luhmann, LCSWA 12/04/2015 10:49 AM

## 2015-12-04 NOTE — [facility] Suicide Risk Assessment (Signed)
[facility] Discharge Suicide Risk Assessment   Principal Problem: Severe recurrent major depression without psychotic features [facility]) Discharge Diagnoses:  Patient Active Problem List   Diagnosis Date Noted  . Severe recurrent major depression without psychotic features (HCC) [F33.2] 12/03/2015  . Ankylosing spondylitis (HCC) [M45.9] 12/03/2015  . Tobacco use disorder [F17.200] 12/03/2015  . Suicide attempt [T14.91XA] 12/01/2015  . Diabetes (HCC) [E11.9] 12/01/2015    Total Time spent with patient: 30 minutes  Musculoskeletal: Strength & Muscle Tone: within normal limits Gait & Station: normal Patient leans: N/A  Psychiatric Specialty Exam: Review of Systems  Musculoskeletal: Positive for back pain.  All other systems reviewed and are negative.   Blood pressure 113/72, pulse 75, temperature 98.2 F (36.8 C), temperature source Oral, resp. rate 18, height 5\' 2"  (1.575 m), weight 67.6 kg (149 lb), SpO2 97 %.Body mass index is 27.25 kg/m.  General Appearance: Casual  Eye Contact::  Good  Speech:  Clear and Coherent409  Volume:  Normal  Mood:  Euthymic  Affect:  Appropriate  Thought Process:  Goal Directed and Descriptions of Associations: Intact  Orientation:  Full (Time, Place, and Person)  Thought Content:  WDL  Suicidal Thoughts:  No  Homicidal Thoughts:  No  Memory:  Immediate;   Fair Recent;   Fair Remote;   Fair  Judgement:  Impaired  Insight:  Shallow  Psychomotor Activity:  Normal  Concentration:  Fair  Recall:  [employer] of Knowledge:Fair  Language: Fair  Akathisia:  No  Handed:  Right  AIMS (if indicated):     Assets:  Communication Skills Desire for Improvement Financial Resources/Insurance Housing Resilience Social Support Transportation  Sleep:  Number of Hours: 9  Cognition: WNL  ADL's:  Intact   Mental Status Per Nursing Assessment::   On Admission:  Suicide plan, Belief that plan would result in death  Demographic Factors:  Divorced or widowed  and Caucasian  Loss Factors: Decline in physical health  Historical Factors: Impulsivity  Risk Reduction Factors:   Sense of responsibility to family, Living with another person, especially a relative and Positive social support  Continued Clinical Symptoms:  Depression:   Impulsivity Chronic Pain  Cognitive Features That Contribute To Risk:  None    Suicide Risk:  Minimal: No identifiable suicidal ideation.  Patients presenting with no risk factors but with morbid ruminations; may be classified as minimal risk based on the severity of the depressive symptoms    Plan Of Care/Follow-up recommendations:  Activity:  As tolerated. Diet:  Low sodium, heart healthy ADA diet. Other:  Keep follow-up appointments.  Kristine Linea, MD 12/04/2015, 12:30 PM

## 2016-04-27 ENCOUNTER — Inpatient Hospital Stay (HOSPITAL_COMMUNITY): Payer: Medicare Other

## 2016-04-27 ENCOUNTER — Inpatient Hospital Stay (HOSPITAL_COMMUNITY)
Admission: EM | Admit: 2016-04-27 | Discharge: 2016-04-29 | DRG: 246 | Disposition: A | Discharge status: Home / Self Care | Payer: Medicare Other | Attending: Cardiology | Admitting: Cardiology

## 2016-04-27 ENCOUNTER — Encounter (HOSPITAL_COMMUNITY): Payer: Self-pay | Admitting: Emergency Medicine

## 2016-04-27 ENCOUNTER — Emergency Department (HOSPITAL_COMMUNITY): Payer: Medicare Other

## 2016-04-27 ENCOUNTER — Encounter (HOSPITAL_COMMUNITY)
Admission: EM | Disposition: A | Discharge status: Home / Self Care | Payer: Self-pay | Source: Home / Self Care | Attending: Cardiology

## 2016-04-27 DIAGNOSIS — Z888 Allergy status to other drugs, medicaments and biological substances status: Secondary | ICD-10-CM

## 2016-04-27 DIAGNOSIS — I214 Non-ST elevation (NSTEMI) myocardial infarction: Secondary | ICD-10-CM | POA: Diagnosis not present

## 2016-04-27 DIAGNOSIS — I5030 Unspecified diastolic (congestive) heart failure: Secondary | ICD-10-CM | POA: Diagnosis not present

## 2016-04-27 DIAGNOSIS — F909 Attention-deficit hyperactivity disorder, unspecified type: Secondary | ICD-10-CM | POA: Diagnosis present

## 2016-04-27 DIAGNOSIS — I5021 Acute systolic (congestive) heart failure: Secondary | ICD-10-CM | POA: Diagnosis present

## 2016-04-27 DIAGNOSIS — I2510 Atherosclerotic heart disease of native coronary artery without angina pectoris: Secondary | ICD-10-CM

## 2016-04-27 DIAGNOSIS — F329 Major depressive disorder, single episode, unspecified: Secondary | ICD-10-CM | POA: Diagnosis present

## 2016-04-27 DIAGNOSIS — Z8249 Family history of ischemic heart disease and other diseases of the circulatory system: Secondary | ICD-10-CM | POA: Diagnosis not present

## 2016-04-27 DIAGNOSIS — I2584 Coronary atherosclerosis due to calcified coronary lesion: Secondary | ICD-10-CM | POA: Diagnosis present

## 2016-04-27 DIAGNOSIS — Z79899 Other long term (current) drug therapy: Secondary | ICD-10-CM

## 2016-04-27 DIAGNOSIS — Z7984 Long term (current) use of oral hypoglycemic drugs: Secondary | ICD-10-CM | POA: Diagnosis not present

## 2016-04-27 DIAGNOSIS — M1990 Unspecified osteoarthritis, unspecified site: Secondary | ICD-10-CM

## 2016-04-27 DIAGNOSIS — E785 Hyperlipidemia, unspecified: Secondary | ICD-10-CM | POA: Diagnosis not present

## 2016-04-27 DIAGNOSIS — D689 Coagulation defect, unspecified: Secondary | ICD-10-CM | POA: Diagnosis not present

## 2016-04-27 DIAGNOSIS — I959 Hypotension, unspecified: Secondary | ICD-10-CM | POA: Diagnosis not present

## 2016-04-27 DIAGNOSIS — E119 Type 2 diabetes mellitus without complications: Secondary | ICD-10-CM | POA: Diagnosis not present

## 2016-04-27 DIAGNOSIS — K661 Hemoperitoneum: Secondary | ICD-10-CM

## 2016-04-27 DIAGNOSIS — Z880 Allergy status to penicillin: Secondary | ICD-10-CM | POA: Diagnosis not present

## 2016-04-27 DIAGNOSIS — E7800 Pure hypercholesterolemia, unspecified: Secondary | ICD-10-CM | POA: Diagnosis present

## 2016-04-27 DIAGNOSIS — Z915 Personal history of self-harm: Secondary | ICD-10-CM | POA: Diagnosis not present

## 2016-04-27 DIAGNOSIS — M62830 Muscle spasm of back: Secondary | ICD-10-CM | POA: Diagnosis present

## 2016-04-27 DIAGNOSIS — I21A9 Other myocardial infarction type: Secondary | ICD-10-CM | POA: Diagnosis present

## 2016-04-27 DIAGNOSIS — I2102 ST elevation (STEMI) myocardial infarction involving left anterior descending coronary artery: Secondary | ICD-10-CM | POA: Diagnosis not present

## 2016-04-27 DIAGNOSIS — M419 Scoliosis, unspecified: Secondary | ICD-10-CM | POA: Diagnosis present

## 2016-04-27 DIAGNOSIS — M064 Inflammatory polyarthropathy: Secondary | ICD-10-CM | POA: Diagnosis present

## 2016-04-27 DIAGNOSIS — Z832 Family history of diseases of the blood and blood-forming organs and certain disorders involving the immune mechanism: Secondary | ICD-10-CM

## 2016-04-27 DIAGNOSIS — F17200 Nicotine dependence, unspecified, uncomplicated: Secondary | ICD-10-CM

## 2016-04-27 DIAGNOSIS — I249 Acute ischemic heart disease, unspecified: Secondary | ICD-10-CM | POA: Diagnosis present

## 2016-04-27 DIAGNOSIS — Z955 Presence of coronary angioplasty implant and graft: Secondary | ICD-10-CM

## 2016-04-27 DIAGNOSIS — F32A Depression, unspecified: Secondary | ICD-10-CM

## 2016-04-27 HISTORY — DX: Attention-deficit hyperactivity disorder, unspecified type: F90.9

## 2016-04-27 HISTORY — DX: Hyperlipidemia, unspecified: E78.5

## 2016-04-27 HISTORY — DX: Type 2 diabetes mellitus without complications: E11.9

## 2016-04-27 HISTORY — DX: Atherosclerotic heart disease of native coronary artery without angina pectoris: I25.10

## 2016-04-27 HISTORY — PX: CORONARY/GRAFT ACUTE MI REVASCULARIZATION: CATH118305

## 2016-04-27 HISTORY — DX: Major depressive disorder, single episode, unspecified: F32.9

## 2016-04-27 HISTORY — DX: Depression, unspecified: F32.A

## 2016-04-27 HISTORY — PX: LEFT HEART CATH AND CORONARY ANGIOGRAPHY: CATH118249

## 2016-04-27 LAB — CBC
HCT: 36.9 % (ref 36.0–46.0)
HEMATOCRIT: 41.8 % (ref 36.0–46.0)
HEMOGLOBIN: 15 g/dL (ref 12.0–15.0)
Hemoglobin: 12.9 g/dL (ref 12.0–15.0)
MCH: 32 pg (ref 26.0–34.0)
MCH: 31.6 pg (ref 26.0–34.0)
MCHC: 35.9 g/dL (ref 30.0–36.0)
MCHC: 35 g/dL (ref 30.0–36.0)
MCV: 89.1 fL (ref 78.0–100.0)
MCV: 90.4 fL (ref 78.0–100.0)
PLATELETS: 182 10*3/uL (ref 150–400)
Platelets: 214 10*3/uL (ref 150–400)
RBC: 4.69 MIL/uL (ref 3.87–5.11)
RBC: 4.08 MIL/uL (ref 3.87–5.11)
RDW: 13 % (ref 11.5–15.5)
RDW: 13.5 % (ref 11.5–15.5)
WBC: 10.5 10*3/uL (ref 4.0–10.5)
WBC: 9.8 10*3/uL (ref 4.0–10.5)

## 2016-04-27 LAB — I-STAT TROPONIN, ED
TROPONIN I, POC: 0.06 ng/mL (ref 0.00–0.08)
Troponin i, poc: 0.12 ng/mL (ref 0.00–0.08)

## 2016-04-27 LAB — BASIC METABOLIC PANEL
ANION GAP: 10 (ref 5–15)
BUN: 14 mg/dL (ref 6–20)
CALCIUM: 10 mg/dL (ref 8.9–10.3)
CHLORIDE: 106 mmol/L (ref 101–111)
CO2: 22 mmol/L (ref 22–32)
Creatinine, Ser: 0.59 mg/dL (ref 0.44–1.00)
GFR calc Af Amer: >60 mL/min (ref >60)
GFR calc non Af Amer: >60 mL/min (ref >60)
GLUCOSE: 241 mg/dL — AB (ref 65–99)
Potassium: 3.9 mmol/L (ref 3.5–5.1)
Sodium: 138 mmol/L (ref 135–145)

## 2016-04-27 LAB — HEPATIC FUNCTION PANEL
ALT: 99 U/L — ABNORMAL HIGH (ref 14–54)
AST: 162 U/L — ABNORMAL HIGH (ref 15–41)
Albumin: 3.5 g/dL (ref 3.5–5.0)
Alkaline Phosphatase: 123 U/L (ref 38–126)
BILIRUBIN INDIRECT: 0.5 mg/dL (ref 0.3–0.9)
Bilirubin, Direct: 0.1 mg/dL (ref 0.1–0.5)
TOTAL PROTEIN: 6.2 g/dL — AB (ref 6.5–8.1)
Total Bilirubin: 0.6 mg/dL (ref 0.3–1.2)

## 2016-04-27 LAB — GLUCOSE, CAPILLARY
GLUCOSE-CAPILLARY: 120 mg/dL — AB (ref 65–99)
GLUCOSE-CAPILLARY: 119 mg/dL — AB (ref 65–99)
GLUCOSE-CAPILLARY: 145 mg/dL — AB (ref 65–99)

## 2016-04-27 LAB — PROTIME-INR
INR: 1.22
PROTHROMBIN TIME: 15.5 s — AB (ref 11.4–15.2)

## 2016-04-27 LAB — MRSA PCR SCREENING: MRSA BY PCR: NEGATIVE

## 2016-04-27 LAB — LIPID PANEL
CHOLESTEROL: 208 mg/dL — AB (ref 0–200)
HDL: 54 mg/dL (ref >40)
LDL Cholesterol: 130 mg/dL — ABNORMAL HIGH (ref 0–99)
TRIGLYCERIDES: 118 mg/dL (ref <150)
Total CHOL/HDL Ratio: 3.9 RATIO
VLDL: 24 mg/dL (ref 0–40)

## 2016-04-27 LAB — TSH: TSH: 0.493 u[IU]/mL (ref 0.350–4.500)

## 2016-04-27 LAB — TROPONIN I
TROPONIN I: 19.26 ng/mL — AB (ref <0.03)
Troponin I: 13.66 ng/mL (ref <0.03)
Troponin I: 31.3 ng/mL (ref <0.03)

## 2016-04-27 LAB — D-DIMER, QUANTITATIVE (NOT AT ARMC): D DIMER QUANT: 0.38 ug{FEU}/mL (ref 0.00–0.50)

## 2016-04-27 SURGERY — LEFT HEART CATH AND CORONARY ANGIOGRAPHY
Anesthesia: LOCAL

## 2016-04-27 MED ORDER — SODIUM CHLORIDE 0.9 % IV SOLN: INTRAVENOUS | Status: AC

## 2016-04-27 MED ORDER — INSULIN ASPART 100 UNIT/ML ~~LOC~~ SOLN
0.0000 [IU] | Freq: Three times a day (TID) | SUBCUTANEOUS | Status: DC
  Administered 2016-04-28 (×2): 2 [IU] via SUBCUTANEOUS
  Administered 2016-04-28: 1 [IU] via SUBCUTANEOUS
  Administered 2016-04-29: 2 [IU] via SUBCUTANEOUS
  Administered 2016-04-29: 1 [IU] via SUBCUTANEOUS

## 2016-04-27 MED ORDER — MORPHINE SULFATE (PF) 4 MG/ML IV SOLN
4.0000 mg | Freq: Once | INTRAVENOUS | Status: AC
  Administered 2016-04-27: 4 mg via INTRAVENOUS
  Filled 2016-04-27: qty 1

## 2016-04-27 MED ORDER — ACETAMINOPHEN 325 MG PO TABS: 650.0000 mg | ORAL_TABLET | ORAL | Status: DC | PRN

## 2016-04-27 MED ORDER — ASPIRIN EC 81 MG PO TBEC: 81.0000 mg | DELAYED_RELEASE_TABLET | Freq: Every day | ORAL | Status: DC

## 2016-04-27 MED ORDER — HEPARIN (PORCINE) IN NACL 2-0.9 UNIT/ML-% IJ SOLN
INTRAMUSCULAR | Status: AC
  Filled 2016-04-27: qty 1000

## 2016-04-27 MED ORDER — HYDRALAZINE HCL 20 MG/ML IJ SOLN: 5.0000 mg | INTRAMUSCULAR | Status: AC | PRN

## 2016-04-27 MED ORDER — NITROGLYCERIN 1 MG/10 ML FOR IR/CATH LAB
INTRA_ARTERIAL | Status: AC
  Filled 2016-04-27: qty 10

## 2016-04-27 MED ORDER — IOPAMIDOL (ISOVUE-370) INJECTION 76%
INTRAVENOUS | Status: DC | PRN
  Administered 2016-04-27: 145 mL via INTRA_ARTERIAL

## 2016-04-27 MED ORDER — NITROGLYCERIN 1 MG/10 ML FOR IR/CATH LAB
INTRA_ARTERIAL | Status: DC | PRN
  Administered 2016-04-27 (×2): 100 ug via INTRACORONARY
  Administered 2016-04-27: 200 ug via INTRACORONARY

## 2016-04-27 MED ORDER — HEPARIN SODIUM (PORCINE) 5000 UNIT/ML IJ SOLN
5000.0000 [IU] | Freq: Three times a day (TID) | INTRAMUSCULAR | Status: DC
  Administered 2016-04-27 – 2016-04-29 (×6): 5000 [IU] via SUBCUTANEOUS
  Filled 2016-04-27 (×6): qty 1

## 2016-04-27 MED ORDER — ONDANSETRON HCL 4 MG/2ML IJ SOLN: 4.0000 mg | Freq: Four times a day (QID) | INTRAMUSCULAR | Status: DC | PRN

## 2016-04-27 MED ORDER — NITROGLYCERIN 0.4 MG SL SUBL
0.4000 mg | SUBLINGUAL_TABLET | SUBLINGUAL | Status: DC | PRN
  Administered 2016-04-27: 0.4 mg via SUBLINGUAL
  Filled 2016-04-27: qty 1

## 2016-04-27 MED ORDER — FENTANYL CITRATE (PF) 100 MCG/2ML IJ SOLN
INTRAMUSCULAR | Status: AC
  Administered 2016-04-27: 25 ug via INTRAVENOUS
  Filled 2016-04-27: qty 2

## 2016-04-27 MED ORDER — LIDOCAINE HCL (PF) 1 % IJ SOLN
INTRAMUSCULAR | Status: AC
  Filled 2016-04-27: qty 30

## 2016-04-27 MED ORDER — FENTANYL CITRATE (PF) 100 MCG/2ML IJ SOLN
25.0000 ug | Freq: Once | INTRAMUSCULAR | Status: AC
  Administered 2016-04-27: 25 ug via INTRAVENOUS

## 2016-04-27 MED ORDER — HYDROCODONE-ACETAMINOPHEN 5-325 MG PO TABS
1.0000 | ORAL_TABLET | Freq: Once | ORAL | Status: AC
  Administered 2016-04-27: 1 via ORAL
  Filled 2016-04-27: qty 1

## 2016-04-27 MED ORDER — MIDAZOLAM HCL 2 MG/2ML IJ SOLN
INTRAMUSCULAR | Status: AC
  Filled 2016-04-27: qty 2

## 2016-04-27 MED ORDER — FENTANYL CITRATE (PF) 100 MCG/2ML IJ SOLN
INTRAMUSCULAR | Status: DC | PRN
Start: 2016-04-27 — End: 2016-04-27
  Administered 2016-04-27: 50 ug via INTRAVENOUS

## 2016-04-27 MED ORDER — MORPHINE SULFATE (PF) 2 MG/ML IV SOLN
2.0000 mg | INTRAVENOUS | Status: DC | PRN
  Administered 2016-04-27 – 2016-04-28 (×4): 4 mg via INTRAVENOUS
  Filled 2016-04-27 (×4): qty 2

## 2016-04-27 MED ORDER — SODIUM CHLORIDE 0.9 % IV SOLN
INTRAVENOUS | Status: DC | PRN
  Administered 2016-04-27: 100 mL/h via INTRAVENOUS

## 2016-04-27 MED ORDER — METHOCARBAMOL 500 MG PO TABS
500.0000 mg | ORAL_TABLET | Freq: Once | ORAL | Status: AC
  Administered 2016-04-27: 500 mg via ORAL
  Filled 2016-04-27: qty 1

## 2016-04-27 MED ORDER — GABAPENTIN 600 MG PO TABS
600.0000 mg | ORAL_TABLET | Freq: Three times a day (TID) | ORAL | Status: DC
  Administered 2016-04-27 – 2016-04-29 (×6): 600 mg via ORAL
  Filled 2016-04-27 (×6): qty 1

## 2016-04-27 MED ORDER — SODIUM CHLORIDE 0.9 % WEIGHT BASED INFUSION: 3.0000 mL/kg/h | INTRAVENOUS | Status: AC

## 2016-04-27 MED ORDER — OXYCODONE-ACETAMINOPHEN 5-325 MG PO TABS
1.0000 | ORAL_TABLET | Freq: Four times a day (QID) | ORAL | Status: DC | PRN
  Administered 2016-04-27 – 2016-04-29 (×4): 1 via ORAL
  Filled 2016-04-27 (×4): qty 1

## 2016-04-27 MED ORDER — ONDANSETRON HCL 4 MG/2ML IJ SOLN
INTRAMUSCULAR | Status: AC
  Filled 2016-04-27: qty 2

## 2016-04-27 MED ORDER — MORPHINE SULFATE (PF) 4 MG/ML IV SOLN
INTRAVENOUS | Status: AC
  Administered 2016-04-27: 4 mg via INTRAVENOUS
  Filled 2016-04-27: qty 1

## 2016-04-27 MED ORDER — MIDAZOLAM HCL 2 MG/2ML IJ SOLN
INTRAMUSCULAR | Status: DC | PRN
  Administered 2016-04-27 (×2): 1 mg via INTRAVENOUS

## 2016-04-27 MED ORDER — SODIUM CHLORIDE 0.9 % IV SOLN: 250.0000 mL | INTRAVENOUS | Status: DC | PRN

## 2016-04-27 MED ORDER — SODIUM CHLORIDE 0.9 % IV SOLN
INTRAVENOUS | Status: DC | PRN
  Administered 2016-04-27: 1.75 mg/kg/h via INTRAVENOUS

## 2016-04-27 MED ORDER — TICAGRELOR 90 MG PO TABS
ORAL_TABLET | ORAL | Status: AC
  Filled 2016-04-27: qty 1

## 2016-04-27 MED ORDER — DIAZEPAM 5 MG/ML IJ SOLN
2.5000 mg | Freq: Four times a day (QID) | INTRAMUSCULAR | Status: DC | PRN
  Administered 2016-04-27: 5 mg via INTRAVENOUS
  Filled 2016-04-27: qty 2

## 2016-04-27 MED ORDER — ASPIRIN 81 MG PO CHEW
81.0000 mg | CHEWABLE_TABLET | Freq: Every day | ORAL | Status: DC
  Administered 2016-04-28 – 2016-04-29 (×2): 81 mg via ORAL
  Filled 2016-04-27 (×2): qty 1

## 2016-04-27 MED ORDER — SODIUM CHLORIDE 0.9 % WEIGHT BASED INFUSION: 1.0000 mL/kg/h | INTRAVENOUS | Status: DC

## 2016-04-27 MED ORDER — TICAGRELOR 90 MG PO TABS
90.0000 mg | ORAL_TABLET | Freq: Two times a day (BID) | ORAL | Status: DC
  Administered 2016-04-27 – 2016-04-29 (×4): 90 mg via ORAL
  Filled 2016-04-27 (×4): qty 1

## 2016-04-27 MED ORDER — SODIUM CHLORIDE 0.9% FLUSH: 3.0000 mL | INTRAVENOUS | Status: DC | PRN

## 2016-04-27 MED ORDER — NITROGLYCERIN IN D5W 200-5 MCG/ML-% IV SOLN: 10.0000 ug/min | INTRAVENOUS | Status: DC

## 2016-04-27 MED ORDER — LIDOCAINE HCL (PF) 1 % IJ SOLN
INTRAMUSCULAR | Status: DC | PRN
  Administered 2016-04-27: 18 mL via SUBCUTANEOUS

## 2016-04-27 MED ORDER — HEPARIN BOLUS VIA INFUSION
3000.0000 [IU] | Freq: Once | INTRAVENOUS | Status: AC
  Administered 2016-04-27: 3000 [IU] via INTRAVENOUS
  Filled 2016-04-27: qty 3000

## 2016-04-27 MED ORDER — TRAMADOL HCL 50 MG PO TABS: 50.0000 mg | ORAL_TABLET | Freq: Four times a day (QID) | ORAL | Status: DC | PRN

## 2016-04-27 MED ORDER — LABETALOL HCL 5 MG/ML IV SOLN: 10.0000 mg | INTRAVENOUS | Status: AC | PRN

## 2016-04-27 MED ORDER — TICAGRELOR 90 MG PO TABS
ORAL_TABLET | ORAL | Status: DC | PRN
  Administered 2016-04-27: 180 mg via ORAL

## 2016-04-27 MED ORDER — TIZANIDINE HCL 4 MG PO TABS
4.0000 mg | ORAL_TABLET | Freq: Three times a day (TID) | ORAL | Status: DC | PRN
  Administered 2016-04-27 – 2016-04-29 (×3): 4 mg via ORAL
  Filled 2016-04-27 (×6): qty 1

## 2016-04-27 MED ORDER — SODIUM CHLORIDE 0.9% FLUSH
3.0000 mL | Freq: Two times a day (BID) | INTRAVENOUS | Status: DC
  Administered 2016-04-27 – 2016-04-29 (×3): 3 mL via INTRAVENOUS

## 2016-04-27 MED ORDER — BIVALIRUDIN BOLUS VIA INFUSION - CUPID
INTRAVENOUS | Status: DC | PRN
  Administered 2016-04-27: 50.7 mg via INTRAVENOUS

## 2016-04-27 MED ORDER — ATORVASTATIN CALCIUM 80 MG PO TABS: 80.0000 mg | ORAL_TABLET | Freq: Every day | ORAL | Status: DC

## 2016-04-27 MED ORDER — DIAZEPAM 5 MG/ML IJ SOLN
5.0000 mg | Freq: Once | INTRAMUSCULAR | Status: AC
  Administered 2016-04-27: 5 mg via INTRAVENOUS

## 2016-04-27 MED ORDER — BIVALIRUDIN TRIFLUOROACETATE 250 MG IV SOLR
INTRAVENOUS | Status: AC
  Filled 2016-04-27: qty 250

## 2016-04-27 MED ORDER — MORPHINE SULFATE (PF) 4 MG/ML IV SOLN
4.0000 mg | Freq: Once | INTRAVENOUS | Status: AC
  Administered 2016-04-27: 4 mg via INTRAVENOUS

## 2016-04-27 MED ORDER — HEPARIN (PORCINE) IN NACL 100-0.45 UNIT/ML-% IJ SOLN
900.0000 [IU]/h | INTRAMUSCULAR | Status: DC
  Administered 2016-04-27: 900 [IU]/h via INTRAVENOUS
  Filled 2016-04-27: qty 250

## 2016-04-27 MED ORDER — ONDANSETRON HCL 4 MG/2ML IJ SOLN
INTRAMUSCULAR | Status: DC | PRN
  Administered 2016-04-27: 4 mg via INTRAVENOUS

## 2016-04-27 MED ORDER — HEPARIN (PORCINE) IN NACL 2-0.9 UNIT/ML-% IJ SOLN
INTRAMUSCULAR | Status: DC | PRN
  Administered 2016-04-27: 1000 mL

## 2016-04-27 MED ORDER — FENTANYL CITRATE (PF) 100 MCG/2ML IJ SOLN
INTRAMUSCULAR | Status: AC
  Filled 2016-04-27: qty 2

## 2016-04-27 SURGICAL SUPPLY — 24 items
BALLN EMERGE MR 2.25X12 (BALLOONS) ×2
BALLN ~~LOC~~ MOZEC 3.0X15 (BALLOONS) ×2
BALLOON EMERGE MR 2.25X12 (BALLOONS) ×1 IMPLANT
BALLOON ~~LOC~~ MOZEC 3.0X15 (BALLOONS) ×1 IMPLANT
CATH EXPO 5F MPA-1 (CATHETERS) ×2 IMPLANT
CATH EXTRAC PRONTO LP 6F RND (CATHETERS) ×2 IMPLANT
CATH INFINITI 5FR JL4 (CATHETERS) ×2 IMPLANT
CATH VISTA GUIDE 6FR XBLAD3.5 (CATHETERS) ×2 IMPLANT
COVER PRB 48X5XTLSCP FOLD TPE (BAG) ×1 IMPLANT
COVER PROBE 5X48 (BAG) ×1
DEVICE CLOSURE PERCLS PRGLD 6F (VASCULAR PRODUCTS) ×2 IMPLANT
ELECT DEFIB PAD ADLT CADENCE (PAD) ×2 IMPLANT
GLIDESHEATH SLEND A-KIT 6F 22G (SHEATH) IMPLANT
KIT ENCORE 26 ADVANTAGE (KITS) ×2 IMPLANT
KIT HEART LEFT (KITS) ×2 IMPLANT
PACK CARDIAC CATHETERIZATION (CUSTOM PROCEDURE TRAY) ×2 IMPLANT
PERCLOSE PROGLIDE 6F (VASCULAR PRODUCTS) ×4
SHEATH PINNACLE 5F 10CM (SHEATH) ×2 IMPLANT
SHEATH PINNACLE 6F 10CM (SHEATH) ×2 IMPLANT
STENT PROMUS PREM MR 2.75X20 (Permanent Stent) ×2 IMPLANT
TRANSDUCER W/STOPCOCK (MISCELLANEOUS) ×2 IMPLANT
TUBING CIL FLEX 10 FLL-RA (TUBING) ×2 IMPLANT
WIRE ASAHI PROWATER 180CM (WIRE) ×2 IMPLANT
WIRE EMERALD 3MM-J .035X150CM (WIRE) ×2 IMPLANT

## 2016-04-27 NOTE — Progress Notes (Signed)
CRITICAL VALUE ALERT  Critical value received:  Troponin 13.66  Date of notification:  04/27/2016  Time of notification:  1216  Critical value read back: Yes   Nurse who received alert: Carolanne Grumbling  MD notified (1st page): Dr. [place]  Time of first page:  1217  MD notified (2nd page):   Time of second page: 1224  Responding MD:  Dr. [place]  Time MD responded:  1227

## 2016-04-27 NOTE — Progress Notes (Signed)
Pt continually refused to have PICC line inserted. I educated her on why she needs to have one and the benefits that it would bring her during this admission.   Pt states that staff has been "unprofessional" and has stuck her too many times without her consent. She declined signing the consent form and insisted in not having the PICC line placed.   IV team called and asked me to cancel the order for tonight.   I will pass info on to day shift RN to see if MD can speak with patient about the importance of having enough access in order to remove sheath.   Melanie Irwin E Mylinda Latina, [place]

## 2016-04-27 NOTE — ED Triage Notes (Signed)
Per [facility]  Pt goes by Melanie Irwin. Pt coming from home with c/o CP prior to bed . CP has been intermittent. 12 leads slowly elevating. No IV acces. 324 ASA per FD. Some nausea and no vomiting. CP described as pressure and sharp 10/10.

## 2016-04-27 NOTE — ED Notes (Signed)
Swelling noted to right arm; IV appears intact and blood returned easily

## 2016-04-27 NOTE — Progress Notes (Signed)
ANTICOAGULATION CONSULT NOTE - Initial Consult  Pharmacy Consult for heparin Indication: chest pain/ACS  Allergies  Allergen Reactions  . Penicillins Anaphylaxis  . Vancomycin Anaphylaxis  . Haldol [Haloperidol] Other (See Comments)    "DVT Induced ......"    Patient Measurements: Height: 5\' 2"  (157.5 cm) Weight: 149 lb 0.5 oz (67.6 kg) IBW/kg (Calculated) : 50.1 Heparin Dosing Weight: 65kg  Vital Signs: Temp: 97.1 F (36.2 C) (04/24 0236) Temp Source: Oral (04/24 0236) BP: 111/59 (04/24 0430) Pulse Rate: 63 (04/24 0430)  Labs:  Recent Labs  04/27/16 0239  HGB 15.0  HCT 41.8  PLT 214  CREATININE 0.59    Estimated Creatinine Clearance: 72.5 mL/min (by C-G formula based on SCr of 0.59 mg/dL).   Medical History: Past Medical History:  Diagnosis Date  . Ankylosing spondylitis (HCC)   . Arthritis   . Birth defect    Spinal cord  . Factor II deficiency (HCC)   . Hashimoto's thyroiditis   . History of spinal cord injury   . Inflammatory arthritis   . Renal disorder   . Scoliosis   . Spinal stenosis      Assessment: 56yo female c/o intermittent sharp CP, initial troponin negative but now rising, to begin heparin.  Goal of Therapy:  Heparin level 0.3-0.7 units/ml Monitor platelets by anticoagulation protocol: Yes   Plan:  Will give heparin 3000 units IV bolus x1 followed by 900 units/hr and monitor heparin levels and CBC.  Vernard Gambles, PharmD, BCPS  04/27/2016,5:28 AM

## 2016-04-27 NOTE — ED Provider Notes (Signed)
Patient seen/examined in the Emergency Department in conjunction with Midlevel Provider [facility] Patient reports chest pain.   Exam : pt awake/alert, but very anxious and writhing around in bed which limits evaluation Plan: plan to workup chest pain, will need to have repeat EKG and repeat troponin.  Will follow closely     Zadie Rhine, MD 04/27/16 [phone]

## 2016-04-27 NOTE — ED Notes (Signed)
Failed IV attempt x2

## 2016-04-27 NOTE — Progress Notes (Signed)
CRITICAL VALUE ALERT  Critical value received: Troponin 31.30  Date of notification:  4/24  Time of notification:  1730  Critical value read back Yes  Nurse who received alert:  Wallis Bamberg  MD notified (1st page):  Barrett  Time of first page:  1736  MD notified (2nd page):  Time of second page:  Responding MD:

## 2016-04-27 NOTE — ED Notes (Signed)
Pt statets pain is 8/10. Pt no longer flailing in bed nor cursing.

## 2016-04-27 NOTE — ED Notes (Signed)
Pt states that the vicodin is not going to work because of her spinal cord injury

## 2016-04-27 NOTE — H&P (Signed)
Cardiology History & Physical    Patient ID: Melanie Irwin MRN: [id number], DOB: [birth year] Date of Encounter: 04/27/2016, 7:26 AM Primary Physician: Corky Downs MD- [place] Primary Cardiologist: New  Chief Complaint: chest pain Reason for Admission: elevated troponin Requesting MD: Dr. 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 No prior cardiac hx or personal h/o clotting. The day before yesterday she began feeling generally poorly with nausea and dizziness upon standing. Yesterday she noticed intermittent chest pain described as heaviness, but this became persistent around 11pm. She has had associated diaphoresis. 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 In the ED she was having severe pain and was given 1 SL with mild improvement but subsequent drop in BP. She was then given 2 doses of morphine and 1 vicodin which she states took the edge off the discomfort. However, she continues with ongoing 8/10 pain (was a "20" per patient). No vomiting, bleeding, lower extremity edema or orthopnea. Not presently SOB, tachycardic or hypoxic. Pain is not worse with inspiration or palpation. + Family hx of CAD - maternal grandmother had heart disease in her 9's, unclear what kind. As above, has h/o factor 2 clotting disorder diagnosed after two family members had clotting issues but she herself has never had a personal clot. She also is intolerant of statins due to weakness. Troponins 0.06->0.12. D-dimer was negative. CBG 241. CXR: Mild central vascular prominence may represent mild congestion, no focal consolidation. Initial EKG nonacute  but subsequent tracing shows subtle early ST elevation II, III, avF, V2-V6 (most notable V4-V6). LDL 212 in 11/2015.   Past Medical History:  Diagnosis Date  . ADHD   . Ankylosing spondylitis (HCC)   . Arthritis   . Birth defect    Spinal cord  . Depression   . Diabetes mellitus (HCC)   . Factor II deficiency (HCC)   . Hashimoto's thyroiditis   . History of spinal cord injury   . Hyperlipidemia   . Inflammatory arthritis   . Renal disorder   . Scoliosis   . Spinal stenosis      Surgical History:  Past Surgical History:  Procedure Laterality Date  . CHOLECYSTECTOMY    . TUBAL LIGATION       Home Meds: Prior to Admission medications   Medication Sig Start Date End Date Taking? Authorizing Provider  gabapentin (NEURONTIN) 600 MG tablet Take 600 mg by mouth 3 (three) times daily.     Historical Provider, MD  metFORMIN (GLUCOPHAGE) 500 MG tablet Take 1 tablet (500 mg total) by mouth 2 (two) times daily with a meal. 12/04/15   Jolanta B Pucilowska, MD  sitaGLIPtin (JANUVIA) 100 MG tablet Take 100 mg by mouth daily.    Historical Provider, MD  tiZANidine (ZANAFLEX) 4 MG tablet Take 4 mg by mouth every 8 (eight) hours as needed for muscle spasms.     Historical Provider, MD    Allergies:  Allergies  Allergen Reactions  . Penicillins Anaphylaxis  . Vancomycin Anaphylaxis  . Haldol [Haloperidol] Other (See Comments)    "DVT Induced ......"    Social History   Social History  . Marital status: Divorced    Spouse name:  N/A  . Number of children: N/A  . Years of education: N/A   Occupational History  . Retired cardiac nurse/disabled    Social History Main Topics  . Smoking status: Current Every Day Smoker  . Smokeless tobacco: Never Used     Comment: >20 years   . Alcohol use No  . Drug use: No  . Sexual activity: Not on file   Other Topics Concern  . Not on file   Social History Narrative  . No narrative on file     Family History  Problem Relation Age of  Onset  . Hypertension Mother   . Lung cancer Father   . Clotting disorder Brother   . Clotting disorder Daughter     Review of Systems: All other systems reviewed and are otherwise negative except as noted above.  Labs:   Lab Results  Component Value Date   WBC 10.5 04/27/2016   HGB 15.0 04/27/2016   HCT 41.8 04/27/2016   MCV 89.1 04/27/2016   PLT 214 04/27/2016    Recent Labs Lab 04/27/16 0239  NA 138  K 3.9  CL 106  CO2 22  BUN 14  CREATININE 0.59  CALCIUM 10.0  GLUCOSE 241*   No results for input(s): CKTOTAL, CKMB, TROPONINI in the last 72 hours. Lab Results  Component Value Date   CHOL 289 (H) 12/03/2015   HDL 48 12/03/2015   LDLCALC 212 (H) 12/03/2015   TRIG 147 12/03/2015   Lab Results  Component Value Date   DDIMER 0.38 04/27/2016    Radiology/Studies:  Dg Chest 2 View  Result Date: 04/27/2016 CLINICAL DATA:  56 year old female with shortness of breath and left-sided chest pain. EXAM: CHEST  2 VIEW COMPARISON:  None. FINDINGS: Top-normal cardiac size with central vascular prominence. There is no focal consolidation, pleural effusion, or pneumothorax. There is mild degenerative changes of the spine. No acute osseous pathology. IMPRESSION: Mild central vascular prominence may represent mild congestion. No focal consolidation. Electronically Signed   By: Elgie Collard M.D.   On: 04/27/2016 03:13   Wt Readings from Last 3 Encounters:  04/27/16 149 lb 0.5 oz (67.6 kg)  12/03/15 149 lb (67.6 kg)  12/01/15 150 lb (68 kg)    EKG: Initial EKG nonacute but subsequent tracing shows NSR with subtle early ST elevation II, III, avF, V2-V6 (most notable V4-V6).    Physical Exam: Blood pressure 103/73, pulse 64, temperature 97.1 F (36.2 C), temperature source Oral, resp. rate 17, height 5\' 2"  (1.575 m), weight 149 lb 0.5 oz (67.6 kg), SpO2 95 %. Body mass index is 27.26 kg/m. General: Well developed, well nourished Wf in no acute distress. Head:  Normocephalic, atraumatic, sclera non-icteric, no xanthomas, nares are without discharge.  Neck: Negative for carotid bruits. JVD not elevated. Lungs: Clear bilaterally to auscultation without wheezes, rales, or rhonchi. Breathing is unlabored. Heart: RRR with S1 S2. No murmurs, rubs, or gallops appreciated. Abdomen: Soft, non-tender, non-distended with normoactive bowel sounds. No hepatomegaly. No rebound/guarding. No obvious abdominal masses. Msk:  Strength and tone appear normal for age. Extremities: No clubbing or cyanosis. No edema.  Distal pedal pulses are 2+ and equal bilaterally. Neuro: Alert and oriented X 3. No focal deficit. No facial asymmetry. Moves all extremities spontaneously. Psych:  Responds to questions appropriately with a normal affect.    Assessment and Plan  63F with diabetes, >20 years of tobacco abuse, hyperlipidemia (intolerant of statins), factor 2 clotting disorder (diagnosed after brother and daughter both had  mesenteric clots, no personal hx of clotting), depression with prior suicide attempt, inflammatory arthritis who [facility] to [facility] with chest pain. Workup reveals ongoing chest pain, early ST elevation and positive troponin with negative d-dimer.  1. NSTEMI - EKG concerning for evolving MI. Multiple cardiac risk factors. Patient has received ASA prior to arrival and has since gotten morphine, SL NTG, heparin without full relief of pain. Recommending emergent cath. Risks and benefits of cardiac catheterization have been discussed with the patient.  These include bleeding, infection, kidney damage, stroke, heart attack, death.  The patient understands these risks and is willing to proceed. Cath lab aware. Also noticed EDP and nurse.  2. Factor 2 clotting disorder - d-dimer negative, not tachycardic, tachypneic or hypoxic.  3. Hyperlipidemia - prior LDL was 212. She reports prior intolerance of statins. Would benefit from referral to lipid clinic at [place]. Check LFTs and  if OK, recommend to consider Zetia.  4. Tobacco abuse - cessation advised.  5. ADHD/depression/inflammatory arthritis - mood currently appropriate for situation. She reports she's no longer taking Cymbalta. She also reports she's on an ADHD med, possibly adderall, that we will hold given her ACS. Continue neurontin per home dose.  6. Diabetes - check A1C. Will need close OP f/u. Prior A1Cs uncontrolled 8-9 range. Hold metformin and Januvia while NPO and pending cath. Add SSI.   Signed, Laurann Montana PA-C 04/27/2016, 7:26 AM Pager: [phone] Patient seen and examined and history reviewed. Agree with above findings and plan. 56 yo female with history of severe hypercholesterolemia, DM, tobacco abuse presents to the ED this am with complaints of chest pain. 2 day history of generalized malaise and not feeling well with diaphoresis. Developed intermittent chest pain yesterday. Pain then became persistent at 11 pm last night. On arrival pain writhing in pain. Initial Ecg normal. Troponin 0/06. Pain persisted despite IV heparin, SL Ntg and Morphine. Repeat troponin 0.12. Ecg now shows subtle ST elevation in the inferior leads and V4-5. No prior cardiac history. No history of clotting clinically although apparently has a Factor 2 clotting disorder. D- dimer is negative. Had CTA of chest in 2014 negative for PE.  On exam she is a pale female in moderate distress Oral mucosa is dry.  No JVD or bruits. Lungs are clear. CV RRR without gallop or murmur Abd. Soft NT. BS + Pulses 2+ throughout. No edema.  Additional labs: glucose 241. Other CBC and chemistries normal. CXR mild vasc. Congestion  Impression: 1. NSTEMI: possible evolving infarct. Ongoing chest pain despite medical therapy. Now with new Ecg changes. Will continue IV heparin and ASA. Start beta blocker. IV Ntg. Recommend urgent cardiac cath to define coronary anatomy and treatment options. The procedure and risks were reviewed including but not  limited to death, myocardial infarction, stroke, arrythmias, bleeding, transfusion, emergency surgery, dye allergy, or renal dysfunction. The patient voices understanding and is agreeable to proceed.. 2. DM type 2. Poorly controlled by history. Last A1c in November 9.1. Will use SSI in hospital. Needs to follow up closely with primary care. 3. Severe hypercholesterolemia. Last LDL 212. Intolerant of statins. States she has been taking niacin. Need to consider Zetia with very low dose statin. Since she is on Medicare not sure she could afford PCSK 9 inhibitor. May need to investigate. 4. Tobacco abuse- counsel on smoking cessation 5. Major depression   Peter [place], MDFACC 04/27/2016 7:53 AM

## 2016-04-27 NOTE — Progress Notes (Signed)
INTERVENTIONAL CARDIOLOGY  Having left lower back pain and now abdominal discomfort.  No IV access has been achieved and therefore the venous sheath is still in place.  Abdomen is soft with positive bowel sounds. Pedal pulses at [facility] are 2 +. The femoral area is free of hematoma. Hgb is 12.8, down from 15.0 on admission. BP and heart rate are stable.  Suspect back pain is mechanical related to recumbancy and spondylitis.  Plan: Get alternative IV access via IV team and if fails, PICC. Pull femoral venous sheath. Plan Abdominal and Pelvic CT to r/o retroperitoneal bleed.

## 2016-04-27 NOTE — ED Notes (Signed)
Transported to cath lab 

## 2016-04-27 NOTE — ED Provider Notes (Signed)
[facility]-EMERGENCY DEPT Provider Note   CSN: [id number] Arrival date & time: 04/27/16  0226     History   Chief Complaint Chief Complaint  Patient presents with  . Chest Pain    HPI Melanie Irwin is a 56 y.o. female.  Patient presents to the ED with a chief complaint of chest pain.  She states that the pain started this evening.  She reports that it has been coming in waves.  She states that the pain is central.  It does not radiate.  She denies any SOB. She denies any history of the same.  Denies history of ACS/PE.  She does have a family history of clotting disorder, but has not been tested herself.  She received 324 of ASA prior to arrival.  She accompanied by her sister, a physician, who she lives with.   The history is provided by the patient. No language interpreter was used.    Past Medical History:  Diagnosis Date  . Ankylosing spondylitis (HCC)   . Arthritis   . Birth defect    Spinal cord  . Factor II deficiency (HCC)   . Hashimoto's thyroiditis   . History of spinal cord injury   . Inflammatory arthritis   . Renal disorder   . Scoliosis   . Spinal stenosis     Patient Active Problem List   Diagnosis Date Noted  . Severe recurrent major depression without psychotic features (HCC) 12/03/2015  . Ankylosing spondylitis (HCC) 12/03/2015  . Tobacco use disorder 12/03/2015  . Suicide attempt (HCC) 12/01/2015  . Diabetes (HCC) 12/01/2015    Past Surgical History:  Procedure Laterality Date  . CHOLECYSTECTOMY    . TUBAL LIGATION      OB History    No data available       Home Medications    Prior to Admission medications   Medication Sig Start Date End Date Taking? Authorizing Provider  DULoxetine (CYMBALTA) 60 MG capsule Take 1 capsule (60 mg total) by mouth daily. 12/05/15   Shari Prows, MD  gabapentin (NEURONTIN) 600 MG tablet Take 600 mg by mouth.    Historical Provider, MD  metFORMIN (GLUCOPHAGE) 500 MG tablet Take 1 tablet (500 mg total)  by mouth 2 (two) times daily with a meal. 12/04/15   Jolanta B Pucilowska, MD  sitaGLIPtin (JANUVIA) 100 MG tablet Take 100 mg by mouth daily.    Historical Provider, MD  tiZANidine (ZANAFLEX) 4 MG tablet Take 4 mg by mouth 3 (three) times daily.    Historical Provider, MD    Family History No family history on file.  Social History Social History  Substance Use Topics  . Smoking status: Current Every Day Smoker  . Smokeless tobacco: Never Used  . Alcohol use No     Allergies   Penicillins; Vancomycin; and Haldol [haloperidol]   Review of Systems Review of Systems  All other systems reviewed and are negative.    Physical Exam Updated Vital Signs BP (!) 146/89   Pulse 65   Temp 97.1 F (36.2 C) (Oral)   Resp (!) 22   SpO2 99%   Physical Exam  Constitutional: She is oriented to person, place, and time. She appears well-developed and well-nourished.  Visibly uncomfortable, tossing and turning in bed  HENT:  Head: Normocephalic and atraumatic.  Eyes: Conjunctivae and EOM are normal. Pupils are equal, round, and reactive to light.  Neck: Normal range of motion. Neck supple.  Cardiovascular: Normal rate and regular rhythm.  Exam  reveals no gallop and no friction rub.   No murmur heard. Pulmonary/Chest: Effort normal and breath sounds normal. No respiratory distress. She has no wheezes. She has no rales. She exhibits no tenderness.  Abdominal: Soft. Bowel sounds are normal. She exhibits no distension and no mass. There is no tenderness. There is no rebound and no guarding.  No focal abdominal tenderness, no RLQ tenderness or pain at McBurney's point, no RUQ tenderness or Murphy's sign, no left-sided abdominal tenderness, no fluid wave, or signs of peritonitis   Musculoskeletal: Normal range of motion. She exhibits no edema or tenderness.  Neurological: She is alert and oriented to person, place, and time.  Skin: Skin is warm and dry.  Psychiatric: She has a normal mood  and affect. Her behavior is normal. Judgment and thought content normal.  Nursing note and vitals reviewed.    ED Treatments / Results  Labs (all labs ordered are listed, but only abnormal results are displayed) Labs Reviewed  BASIC METABOLIC PANEL - Abnormal; Notable for the following:       Result Value   Glucose, Bld 241 (*)    All other components within normal limits  I-STAT TROPOININ, ED - Abnormal; Notable for the following:    Troponin i, poc 0.12 (*)    All other components within normal limits  CBC  D-DIMER, QUANTITATIVE (NOT AT [facility])  I-STAT TROPOININ, ED    EKG  EKG Interpretation  Date/Time:  Tuesday April 27 2016 02:31:10 EDT Ventricular Rate:  67 PR Interval:    QRS Duration: 96 QT Interval:  429 QTC Calculation: 453 R Axis:   58 Text Interpretation:  Sinus rhythm Probable left atrial enlargement No significant change since last tracing Confirmed by Bebe Shaggy  MD, DONALD ([id number]) on 04/27/2016 2:41:21 AM       Radiology Dg Chest 2 View  Result Date: 04/27/2016 CLINICAL DATA:  56 year old female with shortness of breath and left-sided chest pain. EXAM: CHEST  2 VIEW COMPARISON:  None. FINDINGS: Top-normal cardiac size with central vascular prominence. There is no focal consolidation, pleural effusion, or pneumothorax. There is mild degenerative changes of the spine. No acute osseous pathology. IMPRESSION: Mild central vascular prominence may represent mild congestion. No focal consolidation. Electronically Signed   By: Elgie Collard M.D.   On: 04/27/2016 03:13    Procedures Procedures (including critical care time) CRITICAL CARE Performed by: Roxy Horseman   Total critical care time: 45 minutes  Critical care time was exclusive of separately billable procedures and treating other patients.  Critical care was necessary to treat or prevent imminent or life-threatening deterioration.  Critical care was time spent personally by me on the following  activities: development of treatment plan with patient and/or surrogate as well as nursing, discussions with consultants, evaluation of patient's response to treatment, examination of patient, obtaining history from patient or surrogate, ordering and performing treatments and interventions, ordering and review of laboratory studies, ordering and review of radiographic studies, pulse oximetry and re-evaluation of patient's condition.  Medications Ordered in ED Medications  morphine 4 MG/ML injection 4 mg (not administered)     Initial Impression / Assessment and Plan / ED Course  I have reviewed the triage vital signs and the nursing notes.  Pertinent labs & imaging results that were available during my care of the patient were reviewed by me and considered in my medical decision making (see chart for details).     Patient with chest pain that started this evening.  She  has received ASA prior to arrival.  Will give morphine for pain and check labs, CXR, and EKG.  Patient seen by and discussed with Dr. Bebe Shaggy, who recommends repeat troponin.   Suspicious for ACS.  If troponin is climbing, will consult cardiology.  D-dimer is negative.  5:25 AM Troponin is 0.12. Climbing.  Patient still reports some discomfort, but is no longer writhing. Discussed with Dr. Bebe Shaggy, who agrees with plan for cardiology consult and starting heparin.  Patient has not had any recent vaginal bleeding or blood in stool/rectal bleeding.  Appreciate call back from cardiology fellow Dr. Cristal Deer, who will admit the patient.    Final Clinical Impressions(s) / ED Diagnoses   Final diagnoses:  NSTEMI (non-ST elevated myocardial infarction) [facility])    New Prescriptions New Prescriptions   No medications on file     Roxy Horseman, PA-C 04/27/16 0531    Zadie Rhine, MD 04/28/16 0130

## 2016-04-27 NOTE — Progress Notes (Signed)
Paged multiple times earlier today as the patient was having muscle spasms. She has a history of this and reports her current symptoms are similar to prior spasms but much more severe. She has been given Robaxin, Zanaflex, and Morphine throughout the afternoon and she reports this helps for an hour at a time then the pain returns.   Upon my examination, she is rolling back and forth in bed. She is using explicit language saying she is going to leave if he do not help relieve her pain. Right groin site appears stable with venous sheath still in place. There is no evidence of a retroperitoneal bleed on physical examination. Vitals are stable. Will check STAT CBC. Order IV Morphine. Unable to obtain imaging at this time as she cannot lay still for more than a few seconds at a time.   On-call APP aware. Dr. Katrinka Blazing to see later this evening.   Signed, Ellsworth Lennox, PA-C 04/27/2016, 5:56 PM

## 2016-04-27 NOTE — ED Notes (Signed)
Pt states she's "at the hospital, I don't know which fucking one." When asked orientation questions. Pt A&O x4.

## 2016-04-28 ENCOUNTER — Inpatient Hospital Stay (HOSPITAL_COMMUNITY): Payer: Medicare Other

## 2016-04-28 DIAGNOSIS — M1990 Unspecified osteoarthritis, unspecified site: Secondary | ICD-10-CM

## 2016-04-28 DIAGNOSIS — I5030 Unspecified diastolic (congestive) heart failure: Secondary | ICD-10-CM

## 2016-04-28 DIAGNOSIS — E785 Hyperlipidemia, unspecified: Secondary | ICD-10-CM

## 2016-04-28 LAB — BASIC METABOLIC PANEL
ANION GAP: 8 (ref 5–15)
BUN: 6 mg/dL (ref 6–20)
CO2: 26 mmol/L (ref 22–32)
Calcium: 9 mg/dL (ref 8.9–10.3)
Chloride: 103 mmol/L (ref 101–111)
Creatinine, Ser: 0.49 mg/dL (ref 0.44–1.00)
GLUCOSE: 125 mg/dL — AB (ref 65–99)
POTASSIUM: 3.2 mmol/L — AB (ref 3.5–5.1)
Sodium: 137 mmol/L (ref 135–145)

## 2016-04-28 LAB — CBC
HEMATOCRIT: 35 % — AB (ref 36.0–46.0)
Hemoglobin: 12.3 g/dL (ref 12.0–15.0)
MCH: 31.5 pg (ref 26.0–34.0)
MCHC: 35.1 g/dL (ref 30.0–36.0)
MCV: 89.7 fL (ref 78.0–100.0)
Platelets: 172 10*3/uL (ref 150–400)
RBC: 3.9 MIL/uL (ref 3.87–5.11)
RDW: 12.9 % (ref 11.5–15.5)
WBC: 9.6 10*3/uL (ref 4.0–10.5)

## 2016-04-28 LAB — GLUCOSE, CAPILLARY
GLUCOSE-CAPILLARY: 174 mg/dL — AB (ref 65–99)
GLUCOSE-CAPILLARY: 154 mg/dL — AB (ref 65–99)
Glucose-Capillary: 134 mg/dL — ABNORMAL HIGH (ref 65–99)
Glucose-Capillary: 190 mg/dL — ABNORMAL HIGH (ref 65–99)

## 2016-04-28 LAB — ECHOCARDIOGRAM COMPLETE
HEIGHTINCHES: 62 in
Weight: 2582.4 oz

## 2016-04-28 LAB — HEMOGLOBIN A1C
Hgb A1c MFr Bld: 8.2 % — ABNORMAL HIGH (ref 4.8–5.6)
Mean Plasma Glucose: 189 mg/dL

## 2016-04-28 LAB — HIV ANTIBODY (ROUTINE TESTING W REFLEX): HIV SCREEN 4TH GENERATION: NONREACTIVE

## 2016-04-28 LAB — POCT ACTIVATED CLOTTING TIME: ACTIVATED CLOTTING TIME: 367 s

## 2016-04-28 MED ORDER — MORPHINE SULFATE (PF) 4 MG/ML IV SOLN
2.0000 mg | INTRAVENOUS | Status: DC | PRN
  Administered 2016-04-28 (×2): 4 mg via INTRAVENOUS
  Filled 2016-04-28: qty 1

## 2016-04-28 MED ORDER — CARVEDILOL 3.125 MG PO TABS
3.1250 mg | ORAL_TABLET | Freq: Two times a day (BID) | ORAL | Status: DC
  Administered 2016-04-28 (×2): 3.125 mg via ORAL
  Filled 2016-04-28 (×2): qty 1

## 2016-04-28 MED ORDER — LISINOPRIL 2.5 MG PO TABS
2.5000 mg | ORAL_TABLET | Freq: Every day | ORAL | Status: DC
Start: 2016-04-28 — End: 2016-04-29
  Administered 2016-04-28: 2.5 mg via ORAL
  Filled 2016-04-28: qty 1

## 2016-04-28 MED ORDER — MORPHINE SULFATE (PF) 4 MG/ML IV SOLN
INTRAVENOUS | Status: AC
  Filled 2016-04-28: qty 1

## 2016-04-28 NOTE — Progress Notes (Signed)
Progress Note  Patient Name: Damonique Brunelle Date of Encounter: 04/28/2016  Primary Cardiologist: new  Subjective   Patient complains of significant back pain and spasms. Still has femoral venous sheath in place. Unable to establish peripheral IV. Patient refused placement of PICC line last night. Frustrated by failed IV initiation attempts. Currently denies any chest pain or SOB.   Inpatient Medications    Scheduled Meds: . aspirin  81 mg Oral Daily  . gabapentin  600 mg Oral TID  . heparin  5,000 Units Subcutaneous Q8H  . insulin aspart  0-9 Units Subcutaneous TID WC  . sodium chloride flush  3 mL Intravenous Q12H  . ticagrelor  90 mg Oral BID   Continuous Infusions: . sodium chloride    . sodium chloride    . nitroGLYCERIN     PRN Meds: sodium chloride, acetaminophen, diazepam, morphine injection, nitroGLYCERIN, ondansetron (ZOFRAN) IV, oxyCODONE-acetaminophen, sodium chloride flush, tiZANidine   Vital Signs    Vitals:   04/28/16 0400 04/28/16 0500 04/28/16 0600 04/28/16 0750  BP: 94/65 97/64 115/74   Pulse: 84 87 86   Resp: 12 (!) 27 (!) 21   Temp: 98.3 F (36.8 C)   98.7 F (37.1 C)  TempSrc: Oral   Oral  SpO2: (!) 88% 93% 94%   Weight:  161 lb 6.4 oz (73.2 kg)    Height:        Intake/Output Summary (Last 24 hours) at 04/28/16 0802 Last data filed at 04/28/16 0300  Gross per 24 hour  Intake           458.75 ml  Output              250 ml  Net           208.75 ml   Filed Weights   04/27/16 0500 04/28/16 0500  Weight: 149 lb 0.5 oz (67.6 kg) 161 lb 6.4 oz (73.2 kg)    Telemetry    NSR, 5 and 7 beat runs of NSVT yesterday afternoon. No ectopy since - Personally Reviewed  ECG    NSR, anterolateral T wave inversion  - Personally Reviewed  Physical Exam   GEN: WDWF in moderate discomfort secondary to back pain. Neck: No JVD or bruits Cardiac: RRR, no murmurs, rubs, or gallops.  Respiratory: Clear to auscultation bilaterally. GI: Soft,  nontender, non-distended, right groin with venous sheath in place. Soft. No hematoma. Pedal pulses palpable.  MS: No edema; No deformity. Neuro:  Nonfocal  Psych: Normal affect   Labs    Chemistry Recent Labs Lab 04/27/16 0239 04/27/16 1020  NA 138  --   K 3.9  --   CL 106  --   CO2 22  --   GLUCOSE 241*  --   BUN 14  --   CREATININE 0.59  --   CALCIUM 10.0  --   PROT  --  6.2*  ALBUMIN  --  3.5  AST  --  162*  ALT  --  99*  ALKPHOS  --  123  BILITOT  --  0.6  GFRNONAA >60  --   GFRAA >60  --   ANIONGAP 10  --      Hematology Recent Labs Lab 04/27/16 0239 04/27/16 1832  WBC 10.5 9.8  RBC 4.69 4.08  HGB 15.0 12.9  HCT 41.8 36.9  MCV 89.1 90.4  MCH 32.0 31.6  MCHC 35.9 35.0  RDW 13.0 13.5  PLT 214 182    Cardiac Enzymes  Recent Labs Lab 04/27/16 1020 04/27/16 1558 04/27/16 1953  TROPONINI 13.66* 31.30* 19.26*    Recent Labs Lab 04/27/16 0258 04/27/16 0503  TROPIPOC 0.06 0.12*     BNPNo results for input(s): BNP, PROBNP in the last 168 hours.   DDimer  Recent Labs Lab 04/27/16 0247  DDIMER 0.38   A1c 8.2%  Lipid Panel     Component Value Date/Time   CHOL 208 (H) 04/27/2016 0753   TRIG 118 04/27/2016 0753   HDL 54 04/27/2016 0753   CHOLHDL 3.9 04/27/2016 0753   VLDL 24 04/27/2016 0753   LDLCALC 130 (H) 04/27/2016 0753    Radiology    Ct Abdomen Pelvis Wo Contrast  Result Date: 04/28/2016 CLINICAL DATA:  Acute onset of generalized abdominal and back pain. Assess for retroperitoneal hematoma. Initial encounter. EXAM: CT ABDOMEN AND PELVIS WITHOUT CONTRAST TECHNIQUE: Multidetector CT imaging of the abdomen and pelvis was performed following the standard protocol without IV contrast. COMPARISON:  None. FINDINGS: Lower chest: Bibasilar atelectasis is noted. The visualized portions of the mediastinum are unremarkable. Hepatobiliary: The liver is unremarkable in appearance. The patient is status post cholecystectomy, with clips noted at the  gallbladder fossa. The common bile duct remains normal in caliber. Pancreas: The pancreas is within normal limits. Spleen: The spleen is unremarkable in appearance. Adrenals/Urinary Tract: The adrenal glands are unremarkable in appearance. The kidneys are within normal limits. There is no evidence of hydronephrosis. No renal or ureteral stones are identified, though evaluation for stones is limited given contrast in the renal calyces. No perinephric stranding is seen. Stomach/Bowel: The stomach is unremarkable in appearance. The small bowel is within normal limits. The appendix is normal in caliber, without evidence of appendicitis. The colon is unremarkable in appearance. Vascular/Lymphatic: Scattered calcification is seen along the abdominal aorta and its branches. The abdominal aorta is otherwise grossly unremarkable. A circumaortic left renal vein is noted. The inferior vena cava is grossly unremarkable. No retroperitoneal lymphadenopathy is seen. No pelvic sidewall lymphadenopathy is identified. Reproductive: The bladder is partially filled with contrast and grossly unremarkable. The uterus is grossly unremarkable, with an intrauterine device noted in expected position at the fundus of the uterus. No suspicious adnexal masses are seen. Other: No additional soft tissue abnormalities are seen. A right femoral venous line is noted. Musculoskeletal: No acute osseous abnormalities are identified. Facet disease is noted at the lower lumbar spine. The visualized musculature is unremarkable in appearance. IMPRESSION: 1. No acute abnormality seen within the abdomen or pelvis. No evidence of retroperitoneal hematoma. 2. Bibasilar atelectasis noted. 3. Scattered aortic atherosclerosis. Electronically Signed   By: Roanna Raider M.D.   On: 04/28/2016 00:05   Dg Chest 2 View  Result Date: 04/27/2016 CLINICAL DATA:  56 year old female with shortness of breath and left-sided chest pain. EXAM: CHEST  2 VIEW COMPARISON:   None. FINDINGS: Top-normal cardiac size with central vascular prominence. There is no focal consolidation, pleural effusion, or pneumothorax. There is mild degenerative changes of the spine. No acute osseous pathology. IMPRESSION: Mild central vascular prominence may represent mild congestion. No focal consolidation. Electronically Signed   By: Elgie Collard M.D.   On: 04/27/2016 03:13    Cardiac Studies   Procedures   Coronary/Graft Acute MI Revascularization  Left Heart Cath and Coronary Angiography  Conclusion    Acute coronary syndrome with stuttering anteroapical ST elevation myocardial infarction. Pain-free at onset of procedure.  99% thrombotic mid LAD stenosis due to plaque rupture as a culprit for the  presentation. There is also 50% distal LAD stenosis.  30% proximal RCA.  Anteroapical severe hypokinesis/akinesis with EF 40-45%. EDP is elevated suggesting acute left ventricular heart failure.  PCI with stenting of the LAD after aspiration thrombectomy reducing the 99% stenosis to 0% with TIMI grade 3 flow. A 2.75 x 20 Promus Premier was post dilated to 3.0 mm in diameter.  Perclose used for hemostasis and the femoral.  Venous sheath sewn in place.  RECOMMENDATIONS:   Aspirin and Brilinta  Bed rest for at least 4 hours after venous sheath is removed  Risk factor modification     Patient Profile     56 y.o. 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 Troponin positive and dynamic Ecg changes. Ongoing chest pain despite medical therapy. Referred for urgent cardiac cath.  Assessment & Plan    1. NSTEMI. Troponin peak 31.3. Cath findings of critical thrombotic lesion in mid LAD. s/p successful  thrombectomy and stenting with DES. EF 40-45% at cath. No other obstructive CAD. Currently patient is pain free. On DAPT with ASA and Brilinta. Will add low dose beta blocker and ACEi as BP allows. Counseled on smoking cessation.  2. Lack of IV access. Now with only femoral venous sheath for access. This has a negative impact on her ongoing back pain and ability to progress activity. Discussed options including PICC line placement or central line in neck. Patient states she is willing to have PICC line placed today under US guidance. Once IV access obtained we can remove femoral sheath. Will need 1-2 hours bed rest post sheath removal. Then can progress activity  3. Severe back pain/spasms. History of inflammatory arthritis. Has chronic issues with this but exacerbated by acute illness and need to be at bed rest supine. Will manage with muscle relaxants and prn analgesics. Patient reports being followed in a pain clinic in the past.   4. DM A1c 8.2%. Metformin on hold post cath. Will need to resume at DC. Needs close follow up with primary care to optimize glucose control  5. Hypercholesterolemia. Intolerant of multiple meds. Will need referral to lipid clinic post DC to assess options. ? PCSK 9 inhibitor.   6. Acute systolic CHF. EF 40-45% acutely. Echo pending. EDP elevated at cath but no overt symptoms of CHF. Add beta blocker and ACEi and titrate as tolerated. Repeat Echo in 3 months.   7. History of ADHD/ major depression  8. History of Factor 2 clotting disorder. I don't think this is related to her acute MI which appears to be related to plaque rupture and is more platelet driven.     Signed, Tehani Mersman [place], MD  04/28/2016, 8:02 AM

## 2016-04-28 NOTE — Progress Notes (Signed)
Attempted to place PICC in left arm in basilic and brachial veins.  After multiple attempts with successful vein cannulation none resulted in being able to thread guidewire beyond shoulder area.  Patient reports that she has had previous attempts at another facility without success.  Dennie Bible, RN updated.  Gasper Lloyd, RN VAST

## 2016-04-28 NOTE — Progress Notes (Signed)
  Echocardiogram 2D Echocardiogram has been performed.  Melanie Irwin [name] 04/28/2016, 3:23 PM

## 2016-04-28 NOTE — Progress Notes (Signed)
Peripherally Inserted Central Catheter/Midline Placement  The IV Nurse has discussed with the patient and/or persons authorized to consent for the patient, the purpose of this procedure and the potential benefits and risks involved with this procedure.  The benefits include less needle sticks, lab draws from the catheter, and the patient may be discharged home with the catheter. Risks include, but not limited to, infection, bleeding, blood clot (thrombus formation), and puncture of an artery; nerve damage and irregular heartbeat and possibility to perform a PICC exchange if needed/ordered by physician.  Alternatives to this procedure were also discussed.  Bard Power PICC patient education guide, fact sheet on infection prevention and patient information card has been provided to patient /or left at bedside.    PICC/Midline Placement Documentation        Melanie Irwin, [name] 04/28/2016, 11:23 AM

## 2016-04-28 NOTE — Progress Notes (Signed)
  Echocardiogram 2D Echocardiogram has been performed.  Melanie Irwin 04/28/2016, 3:24 PM

## 2016-04-28 NOTE — Care Management Note (Signed)
Case Management Note Donn Pierini RN, BSN Unit 2W-Case Manager-- 2H coverage [phone]  Patient Details  Name: Melanie Irwin MRN: [id number] Date of Birth: 11/19/1960  Subjective/Objective:  Pt admitted with NSTEMI- s/p cath with successful thrombectomy and stenting with DES to LAD                 Action/Plan: PTA Pt lived at home - independent- pt has been started on Brilinta- insurance benefits check has been submitted for copay cost- CM to f/u with pt prior to discharge and provide 30 day free card for Brilinta  Expected Discharge Date:                  Expected Discharge Plan:  Home/Self Care  In-House Referral:     Discharge planning Services  CM Consult, Medication Assistance  Post Acute Care Choice:    Choice offered to:     DME Arranged:    DME Agency:     HH Arranged:    HH Agency:     Status of Service:  In process, will continue to follow  If discussed at Long Length of Stay Meetings, dates discussed:    Discharge Disposition:   Additional Comments:  Darrold Span, RN 04/28/2016, 10:53 AM

## 2016-04-29 ENCOUNTER — Other Ambulatory Visit: Payer: Self-pay | Admitting: Cardiology

## 2016-04-29 ENCOUNTER — Encounter (HOSPITAL_COMMUNITY): Payer: Self-pay | Admitting: Cardiology

## 2016-04-29 ENCOUNTER — Telehealth: Payer: Self-pay | Admitting: Cardiovascular Disease

## 2016-04-29 DIAGNOSIS — Z79899 Other long term (current) drug therapy: Secondary | ICD-10-CM

## 2016-04-29 LAB — CBC
HCT: 35.4 % — ABNORMAL LOW (ref 36.0–46.0)
Hemoglobin: 12.5 g/dL (ref 12.0–15.0)
MCH: 31.7 pg (ref 26.0–34.0)
MCHC: 35.3 g/dL (ref 30.0–36.0)
MCV: 89.8 fL (ref 78.0–100.0)
PLATELETS: 174 10*3/uL (ref 150–400)
RBC: 3.94 MIL/uL (ref 3.87–5.11)
RDW: 12.9 % (ref 11.5–15.5)
WBC: 8.8 10*3/uL (ref 4.0–10.5)

## 2016-04-29 LAB — GLUCOSE, CAPILLARY
GLUCOSE-CAPILLARY: 168 mg/dL — AB (ref 65–99)
Glucose-Capillary: 146 mg/dL — ABNORMAL HIGH (ref 65–99)

## 2016-04-29 MED ORDER — CARVEDILOL 3.125 MG PO TABS: 3.1250 mg | ORAL_TABLET | Freq: Two times a day (BID) | ORAL | 3 refills | Status: DC

## 2016-04-29 MED ORDER — TICAGRELOR 90 MG PO TABS: 90.0000 mg | ORAL_TABLET | Freq: Two times a day (BID) | ORAL | 10 refills | Status: DC

## 2016-04-29 MED ORDER — TICAGRELOR 90 MG PO TABS: 90.0000 mg | ORAL_TABLET | Freq: Two times a day (BID) | ORAL | 0 refills | Status: AC

## 2016-04-29 MED ORDER — LISINOPRIL 2.5 MG PO TABS: 2.5000 mg | ORAL_TABLET | Freq: Every day | ORAL | 3 refills | Status: DC

## 2016-04-29 MED ORDER — ASPIRIN 81 MG PO CHEW: 81.0000 mg | CHEWABLE_TABLET | Freq: Every day | ORAL | Status: AC

## 2016-04-29 MED ORDER — NITROGLYCERIN 0.4 MG SL SUBL: 0.4000 mg | SUBLINGUAL_TABLET | SUBLINGUAL | 3 refills | Status: AC | PRN

## 2016-04-29 NOTE — Progress Notes (Signed)
Dr. [place] notified of persistent low blood pressures with MAP remaining >65.  Patient asymptomatic.  Requested parameters for holding medications.  Orders received to not give this morning's coreg or lisinopril.  Dr. [place] will evaluate medication doses during rounds.  Will continue to monitor pt closely.

## 2016-04-29 NOTE — Telephone Encounter (Signed)
TCM...the patient saw Dr [place] in [place]  She is being discharged today and would like to see Dr Mariah Milling She is scheduled for 05/13/16 and is on the waiting list.   Please call

## 2016-04-29 NOTE — Progress Notes (Signed)
CARDIAC REHAB PHASE I   PRE:  Rate/Rhythm: 87 SR  BP:  Supine: 100/60  Sitting:   Standing:    SaO2: 94%RA  MODE:  Ambulation: 425 ft   POST:  Rate/Rhythm: 103 ST  BP:  Supine: 109/70  Sitting:   Standing:    SaO2: 93%RA 1050-1135 Pt walked 425 ft on RA with steady gait. No c/o CP or dizziness. Gave pt stent card and explained importance of brilinta with stent. Case manager going to see pt after I visited with pt. Discussed that pt needs to avoid NSAIDS with this medication but pt stated that she needs it for back pain. Reviewed NTG use. Pt stated she is going to eat what she wants, continue to smoke. Left smoking cessation handout. Offered fake cigarette. Left heart healthy and diabetic diets. Encouraged walking as tolerated for ex. Will refer to [facility] CRP 2 but pt stated will be unable to do program with back issues.  Left MI booklet and handouts for pt to read.    Luetta Nutting, RN BSN  04/29/2016 11:30 AM

## 2016-04-29 NOTE — Discharge Summary (Signed)
Discharge Summary    Patient ID: Melanie Irwin,  MRN: [id number], DOB/AGE: 07-10-1960 56 y.o.  Admit date: 04/27/2016 Discharge date: 04/29/2016  Primary Care Provider: No PCP Per Patient Primary Cardiologist: (New) Gollan  Discharge Diagnoses    Principal Problem:   NSTEMI (non-ST elevated myocardial infarction) (HCC) Active Problems:   Diabetes (HCC)   Tobacco use disorder   ACS (acute coronary syndrome) (HCC)   Depression   Inflammatory arthritis   Hyperlipidemia LDL goal <70   Clotting disorder (HCC)   Allergies Allergies  Allergen Reactions  . Penicillins Anaphylaxis  . Tramadol Shortness Of Breath  . Vancomycin Anaphylaxis  . Haldol [Haloperidol] Other (See Comments)    "DVT Induced ......"  . Statins Other (See Comments)    Weakness, feels like she can't move. Causes MS exacerbation    Diagnostic Studies/Procedures    LHC: 04/27/16  Conclusion    Acute coronary syndrome with stuttering anteroapical ST elevation myocardial infarction. Pain-free at onset of procedure.  99% thrombotic mid LAD stenosis due to plaque rupture as a culprit for the presentation. There is also 50% distal LAD stenosis.  30% proximal RCA.  Anteroapical severe hypokinesis/akinesis with EF 40-45%. EDP is elevated suggesting acute left ventricular heart failure.  PCI with stenting of the LAD after aspiration thrombectomy reducing the 99% stenosis to 0% with TIMI grade 3 flow. A 2.75 x 20 Promus Premier was post dilated to 3.0 mm in diameter.  Perclose used for hemostasis and the femoral.  Venous sheath sewn in place.  RECOMMENDATIONS:   Aspirin and Brilinta  Bed rest for at least 4 hours after venous sheath is removed  Risk factor modification   TTE: 04/28/16  Study Conclusions  - Left ventricle: The cavity size was normal. Wall thickness was   normal. Systolic function was mildly reduced. The estimated   ejection fraction was in the range of 45% to 50%.  Akinesis of the   mid anteroseptal wall, apical septal wall, and true apex. Doppler   parameters are consistent with abnormal left ventricular   relaxation (grade 1 diastolic dysfunction). - Aortic valve: There was no stenosis. - Mitral valve: There was no significant regurgitation. - Right ventricle: The cavity size was normal. Systolic function   was normal. - Pulmonary arteries: No complete TR doppler jet so unable to   estimate PA systolic pressure. - Systemic veins: IVC measured 2.3 cm with > 50% respirophasic   variation, suggesting RA pressure 8 mmHg.  Impressions:  - Normal LV size with mildly decreased systolic function, EF   45-50%. Wall motion abnormalities in LAD distribution. Normal RV   size and systolic function. No significant valvular   abnormalities. 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 No prior cardiac hx or personal h/o clotting. The day before admission she began feeling generally poorly with nausea and dizziness upon standing. She noticed intermittent chest pain described as heaviness, but this became persistent around 11pm the night prior to admission. She had associated diaphoresis. 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 In the ED she was having severe pain and was given 1 SL with  mild improvement but subsequent drop in BP. She was then given 2 doses of morphine and 1 vicodin which she states took the edge off the discomfort. However, she continued to have ongoing 8/10 pain (was a "20" per patient). No vomiting, bleeding, lower extremity edema or orthopnea. Not  presently SOB, tachycardic or hypoxic. Pain is not worse with inspiration or palpation. + Family hx of CAD - maternal grandmother had heart disease in her 61's, unclear what kind. Has h/o factor 2 clotting disorder diagnosed after two family members had clotting issues but she herself has never had a personal clot. She also is intolerant of statins due to weakness. Troponins 0.06->0.12. D-dimer was negative. CBG 241. CXR: Mild central vascular prominence may represent mild congestion, no focal consolidation. Initial EKG nonacute but subsequent tracing shows subtle early ST elevation II, III, avF, V2-V6 (most notable V4-V6). LDL 212 in 11/2015.  Hospital Course     She underwent cardiac cath noted above with successful thrombectomy with stenting to the mLAD with DES x1. Troponin peak 31.3. EF 40-45% at cath. No other obstructive CAD.  -On DAPT with ASA and Brilinta. Added low dose beta blocker and ACEi as BP as blood pressure tolerated. Did have soft blood pressure after starting these medications. Was instructed to monitor her blood pressure at home and hold if < 100 systolic. Follow up echo showed EF of 45-50%, G1DD, and WMA noted in the LAD area. She was also counseled on smoking cessation. Also has history of severe back pain with inflammatory arthritis, which was managed with PRN medication this admission. Stated she had been followed by a pain clinic in the past. RN staff had difficult time obtaining peripheral IV, and femoral sheath was left in place until IV access was obtained. Femoral sheath was removed on 04/28/16, and groin site stable the following day. Hgb A1c was 8.2, metformin was held pre/post cath. She as instructed to follow up with PCP after DC for continued follow up. Noted to be intolerant of multiple statins. Consideration of PCSK 9 inhibitors at outpatient follow up appt. Once her back pain was better controlled she was able to ambulate with cardiac rehab without chest pain. She was given  a 30 day card for Brilinta prior to discharge. Her blood pressure was noted to be soft the day of discharge, but once she was up and ambulated this improved. She was instructed to follow her BP at home and when to hold medications.   She was seen on 04/29/16 by Dr. [place] and determined stable for discharge home. Follow up in the [facility] office has been arranged. Medications are noted below.  _____________  Discharge Vitals Blood pressure 103/63, pulse 88, temperature 99.7 F (37.6 C), temperature source Oral, resp. rate 19, height 5\' 2"  (1.575 m), weight 161 lb 6.4 oz (73.2 kg), SpO2 94 %.  Filed Weights   04/27/16 0500 04/28/16 0500  Weight: 149 lb 0.5 oz (67.6 kg) 161 lb 6.4 oz (73.2 kg)    Labs & Radiologic Studies    CBC  Recent Labs  04/28/16 1549 04/29/16 0412  WBC 9.6 8.8  HGB 12.3 12.5  HCT 35.0* 35.4*  MCV 89.7 89.8  PLT 172 174   Basic Metabolic Panel  Recent Labs  04/27/16 0239 04/28/16 1549  NA 138 137  K 3.9 3.2*  CL 106 103  CO2 22 26  GLUCOSE 241* 125*  BUN 14 6  CREATININE 0.59 0.49  CALCIUM 10.0 9.0   Liver Function Tests  Recent Labs  04/27/16 1020  AST 162*  ALT 99*  ALKPHOS 123  BILITOT 0.6  PROT 6.2*  ALBUMIN 3.5   No results for input(s): LIPASE, AMYLASE in the last 72 hours. Cardiac Enzymes  Recent Labs  04/27/16 1020 04/27/16 1558 04/27/16 1953  TROPONINI 13.66* 31.30* 19.26*   BNP Invalid input(s): POCBNP D-Dimer  Recent Labs  04/27/16 0247  DDIMER 0.38   Hemoglobin A1C  Recent Labs  04/27/16 1043  HGBA1C 8.2*   Fasting Lipid Panel  Recent Labs  04/27/16 0753  CHOL 208*  HDL 54  LDLCALC 130*  TRIG 118  CHOLHDL 3.9   Thyroid Function Tests  Recent Labs  04/27/16 1022  TSH 0.493   _____________  Ct Abdomen Pelvis Wo Contrast  Result Date: 04/28/2016 CLINICAL DATA:  Acute onset of generalized abdominal and back pain. Assess for retroperitoneal hematoma. Initial encounter. EXAM: CT ABDOMEN  AND PELVIS WITHOUT CONTRAST TECHNIQUE: Multidetector CT imaging of the abdomen and pelvis was performed following the standard protocol without IV contrast. COMPARISON:  None. FINDINGS: Lower chest: Bibasilar atelectasis is noted. The visualized portions of the mediastinum are unremarkable. Hepatobiliary: The liver is unremarkable in appearance. The patient is status post cholecystectomy, with clips noted at the gallbladder fossa. The common bile duct remains normal in caliber. Pancreas: The pancreas is within normal limits. Spleen: The spleen is unremarkable in appearance. Adrenals/Urinary Tract: The adrenal glands are unremarkable in appearance. The kidneys are within normal limits. There is no evidence of hydronephrosis. No renal or ureteral stones are identified, though evaluation for stones is limited given contrast in the renal calyces. No perinephric stranding is seen. Stomach/Bowel: The stomach is unremarkable in appearance. The small bowel is within normal limits. The appendix is normal in caliber, without evidence of appendicitis. The colon is unremarkable in appearance. Vascular/Lymphatic: Scattered calcification is seen along the abdominal aorta and its branches. The abdominal aorta is otherwise grossly unremarkable. A circumaortic left renal vein is noted. The inferior vena cava is grossly unremarkable. No retroperitoneal lymphadenopathy is seen. No pelvic sidewall lymphadenopathy is identified. Reproductive: The bladder is partially filled with contrast and grossly unremarkable. The uterus is grossly unremarkable, with an intrauterine device noted in expected position at the fundus of the uterus. No suspicious adnexal masses are seen. Other: No additional soft tissue abnormalities are seen. A right femoral venous line is noted. Musculoskeletal: No acute osseous abnormalities are identified. Facet disease is noted at the lower lumbar spine. The visualized musculature is unremarkable in appearance.  IMPRESSION: 1. No acute abnormality seen within the abdomen or pelvis. No evidence of retroperitoneal hematoma. 2. Bibasilar atelectasis noted. 3. Scattered aortic atherosclerosis. Electronically Signed   By: Roanna Raider M.D.   On: 04/28/2016 00:05   Dg Chest 2 View  Result Date: 04/27/2016 CLINICAL DATA:  56 year old female with shortness of breath and left-sided chest pain. EXAM: CHEST  2 VIEW COMPARISON:  None. FINDINGS: Top-normal cardiac size with central vascular prominence. There is no focal consolidation, pleural effusion, or pneumothorax. There is mild degenerative changes of the spine. No acute osseous pathology. IMPRESSION: Mild central vascular prominence may represent mild congestion. No focal consolidation. Electronically Signed   By: Elgie Collard M.D.   On: 04/27/2016 03:13   Disposition   Pt is being discharged home today in good condition.  Follow-up Plans & Appointments    Follow-up Information    Julien Nordmann, MD Follow up on 05/14/2016.   Specialty:  Cardiology Why:  at 11am for  your follow up appt.  Contact information: [address] [place] [address] [phone]        [facility]  Follow up on 05/06/2016.   Specialty:  Cardiology Why:  Please come in for follow up labs Contact information: [address], [address] [place] [address] [phone]         Discharge Instructions    AMB Referral to Cardiac Rehabilitation - Phase II    Complete by:  As directed    Diagnosis:  STEMI   Amb Referral to Cardiac Rehabilitation    Complete by:  As directed    Pt states unable to tolerate program due to back issues. Will walk as tolerated for exercise   Diagnosis:   NSTEMI Coronary Stents     Call MD for:  redness, tenderness, or signs of infection (pain, swelling, redness, odor or green/yellow discharge around incision site)    Complete by:  As directed    Diet - low sodium heart healthy     Complete by:  As directed    Discharge instructions    Complete by:  As directed    Groin Site Care Refer to this sheet in the next few weeks. These instructions provide you with information on caring for yourself after your procedure. Your caregiver may also give you more specific instructions. Your treatment has been planned according to current medical practices, but problems sometimes occur. Call your caregiver if you have any problems or questions after your procedure. HOME CARE INSTRUCTIONS You may shower 24 hours after the procedure. Remove the bandage (dressing) and gently wash the site with plain soap and water. Gently pat the site dry.  Do not apply powder or lotion to the site.  Do not sit in a bathtub, swimming pool, or whirlpool for 5 to 7 days.  No bending, squatting, or lifting anything over 10 pounds (4.5 kg) as directed by your caregiver.  Inspect the site at least twice daily.  Do not drive home if you are discharged the same day of the procedure. Have someone else drive you.  You may drive 24 hours after the procedure unless otherwise instructed by your caregiver.  What to expect: Any bruising will usually fade within 1 to 2 weeks.  Blood that collects in the tissue (hematoma) may be painful to the touch. It should usually decrease in size and tenderness within 1 to 2 weeks.  SEEK IMMEDIATE MEDICAL CARE IF: You have unusual pain at the groin site or down the affected leg.  You have redness, warmth, swelling, or pain at the groin site.  You have drainage (other than a small amount of blood on the dressing).  You have chills.  You have a fever or persistent symptoms for more than 72 hours.  You have a fever and your symptoms suddenly get worse.  Your leg becomes pale, cool, tingly, or numb.  You have heavy bleeding from the site. Hold pressure on the site. .   Please monitor your blood pressure daily at home. If the systolic (top number) is less than 100 please hold your  blood pressure medications. Bring these readings for your follow up appt.   Increase activity slowly    Complete by:  As directed       Discharge Medications   Current Discharge Medication List    START taking these medications   Details  aspirin 81 MG chewable tablet Chew 1 tablet (81 mg total) by mouth  daily.    carvedilol (COREG) 3.125 MG tablet Take 1 tablet (3.125 mg total) by mouth 2 (two) times daily with a meal. Qty: 60 tablet, Refills: 3    lisinopril (PRINIVIL,ZESTRIL) 2.5 MG tablet Take 1 tablet (2.5 mg total) by mouth daily. Qty: 30 tablet, Refills: 3    nitroGLYCERIN (NITROSTAT) 0.4 MG SL tablet Place 1 tablet (0.4 mg total) under the tongue every 5 (five) minutes x 3 doses as needed for chest pain. Qty: 25 tablet, Refills: 3    ticagrelor (BRILINTA) 90 MG TABS tablet Take 1 tablet (90 mg total) by mouth 2 (two) times daily. Qty: 60 tablet, Refills: 0      CONTINUE these medications which have NOT CHANGED   Details  amphetamine-dextroamphetamine (ADDERALL) 20 MG tablet Take 30 mg by mouth 2 (two) times daily.    gabapentin (NEURONTIN) 600 MG tablet Take 600 mg by mouth 3 (three) times daily.     metFORMIN (GLUCOPHAGE) 500 MG tablet Take 1 tablet (500 mg total) by mouth 2 (two) times daily with a meal. Qty: 60 tablet, Refills: 1    niacin (NIASPAN) 500 MG CR tablet Take 500 mg by mouth daily.    sitaGLIPtin (JANUVIA) 100 MG tablet Take 100 mg by mouth daily.    tiZANidine (ZANAFLEX) 4 MG tablet Take 4 mg by mouth every 8 (eight) hours as needed for muscle spasms.          Aspirin prescribed at discharge?  Yes High Intensity Statin Prescribed? (Lipitor 40-80mg  or Crestor 20-40mg ): No: Statin intolerant, consider PCSK 9 inhibitors Beta Blocker Prescribed? Yes For EF <40%, was ACEI/ARB Prescribed? Yes ADP Receptor Inhibitor Prescribed? (i.e. Plavix etc.-Includes Medically Managed Patients): Yes For EF <40%, Aldosterone Inhibitor Prescribed? No: EF ok Was EF  assessed during THIS hospitalization? Yes Was Cardiac Rehab II ordered? (Included Medically managed Patients): Yes   Outstanding Labs/Studies   BMET in one week.   Duration of Discharge Encounter   Greater than 30 minutes including physician time.  Signed, Laverda Page NP-C 04/29/2016, 3:01 PM

## 2016-04-29 NOTE — Progress Notes (Signed)
Per Theatre stage manager on Brilinta # 7  S/W ANGEL @ [employer] [facility] # [phone] OPT- 2   BRILINTA  90 MG  BID   COVER- YES  CO-PAY- $ 8.35    TIER- 3 DRUG  PRIOR APPROVAL- NO   PHARMACY : [facility]   90 DAY SUPPLY @ RETAIL ALSO $ 8.35

## 2016-04-29 NOTE — Care Management Note (Signed)
Case Management Note Donn Pierini RN, BSN Unit 2W-Case Manager-- 2H coverage [phone]  Patient Details  Name: Melanie Irwin MRN: [id number] Date of Birth: 19-Jan-1960  Subjective/Objective:  Pt admitted with NSTEMI- s/p cath with successful thrombectomy and stenting with DES to LAD                 Action/Plan: PTA Pt lived at home - independent- pt has been started on Brilinta- insurance benefits check has been submitted for copay cost- CM to f/u with pt prior to discharge and provide 30 day free card for Brilinta  Expected Discharge Date:                  Expected Discharge Plan:  Home/Self Care  In-House Referral:     Discharge planning Services  CM Consult, Medication Assistance  Post Acute Care Choice:    Choice offered to:     DME Arranged:    DME Agency:     HH Arranged:    HH Agency:     Status of Service:  Completed, signed off  If discussed at [employer] of Stay Meetings, dates discussed:    Discharge Disposition: home/self care   Additional Comments:  04/29/16- 1130- Donn Pierini RN, CM- per insurance check on Brilinta- copay $8.35/mo- spoke with pt and coverage info shared- pt also given 30 day free card to use on discharge. No further needs noted at this time.   Darrold Span, RN 04/29/2016, 11:32 AM

## 2016-04-29 NOTE — Progress Notes (Signed)
Progress Note  Patient Name: Melanie Irwin Date of Encounter: 04/29/2016  Primary Cardiologist: [place] F/u ([place])  Subjective   Back pain is better this morning. Femoral sheath now out, has peripheral IV.   Inpatient Medications    Scheduled Meds: . aspirin  81 mg Oral Daily  . carvedilol  3.125 mg Oral BID WC  . gabapentin  600 mg Oral TID  . heparin  5,000 Units Subcutaneous Q8H  . insulin aspart  0-9 Units Subcutaneous TID WC  . lisinopril  2.5 mg Oral Daily  . sodium chloride flush  3 mL Intravenous Q12H  . ticagrelor  90 mg Oral BID   Continuous Infusions: . sodium chloride Stopped (04/28/16 1145)   PRN Meds: sodium chloride, acetaminophen, diazepam, morphine injection, nitroGLYCERIN, ondansetron (ZOFRAN) IV, oxyCODONE-acetaminophen, sodium chloride flush, tiZANidine   Vital Signs    Vitals:   04/29/16 0400 04/29/16 0500 04/29/16 0600 04/29/16 0800  BP:   (!) 76/57 (!) 81/60  Pulse: 95 85 81 84  Resp:   16 16  Temp: 98.5 F (36.9 C)   98.9 F (37.2 C)  TempSrc: Oral   Oral  SpO2: 93% 93% 92% 93%  Weight:      Height:        Intake/Output Summary (Last 24 hours) at 04/29/16 1048 Last data filed at 04/28/16 2000  Gross per 24 hour  Intake            737.5 ml  Output                0 ml  Net            737.5 ml   Filed Weights   04/27/16 0500 04/28/16 0500  Weight: 149 lb 0.5 oz (67.6 kg) 161 lb 6.4 oz (73.2 kg)    Telemetry    SR - Personally Reviewed  ECG    N/a - Personally Reviewed  Physical Exam   General: Well developed, well nourished, female appearing in no acute distress. Head: Normocephalic, atraumatic.  Neck: Supple without bruits, JVD. Lungs:  Resp regular and unlabored, CTA. Heart: RRR, S1, S2, no S3, S4, or murmur; no rub. Abdomen: Soft, non-tender, non-distended with normoactive bowel sounds. No hepatomegaly. No rebound/guarding. No obvious abdominal masses. Extremities: No clubbing, cyanosis, edema. Distal pedal pulses  are 2+ bilaterally. Right femoral site stable without hematoma Neuro: Alert and oriented X 3. Moves all extremities spontaneously. Psych: Normal affect.  Labs    Chemistry Recent Labs Lab 04/27/16 0239 04/27/16 1020 04/28/16 1549  NA 138  --  137  K 3.9  --  3.2*  CL 106  --  103  CO2 22  --  26  GLUCOSE 241*  --  125*  BUN 14  --  6  CREATININE 0.59  --  0.49  CALCIUM 10.0  --  9.0  PROT  --  6.2*  --   ALBUMIN  --  3.5  --   AST  --  162*  --   ALT  --  99*  --   ALKPHOS  --  123  --   BILITOT  --  0.6  --   GFRNONAA >60  --  >60  GFRAA >60  --  >60  ANIONGAP 10  --  8     Hematology Recent Labs Lab 04/27/16 1832 04/28/16 1549 04/29/16 0412  WBC 9.8 9.6 8.8  RBC 4.08 3.90 3.94  HGB 12.9 12.3 12.5  HCT 36.9 35.0* 35.4*  MCV 90.4 89.7 89.8  MCH 31.6 31.5 31.7  MCHC 35.0 35.1 35.3  RDW 13.5 12.9 12.9  PLT 182 172 174    Cardiac Enzymes Recent Labs Lab 04/27/16 1020 04/27/16 1558 04/27/16 1953  TROPONINI 13.66* 31.30* 19.26*    Recent Labs Lab 04/27/16 0258 04/27/16 0503  TROPIPOC 0.06 0.12*     BNPNo results for input(s): BNP, PROBNP in the last 168 hours.   DDimer  Recent Labs Lab 04/27/16 0247  DDIMER 0.38      Radiology    Ct Abdomen Pelvis Wo Contrast  Result Date: 04/28/2016 CLINICAL DATA:  Acute onset of generalized abdominal and back pain. Assess for retroperitoneal hematoma. Initial encounter. EXAM: CT ABDOMEN AND PELVIS WITHOUT CONTRAST TECHNIQUE: Multidetector CT imaging of the abdomen and pelvis was performed following the standard protocol without IV contrast. COMPARISON:  None. FINDINGS: Lower chest: Bibasilar atelectasis is noted. The visualized portions of the mediastinum are unremarkable. Hepatobiliary: The liver is unremarkable in appearance. The patient is status post cholecystectomy, with clips noted at the gallbladder fossa. The common bile duct remains normal in caliber. Pancreas: The pancreas is within normal limits.  Spleen: The spleen is unremarkable in appearance. Adrenals/Urinary Tract: The adrenal glands are unremarkable in appearance. The kidneys are within normal limits. There is no evidence of hydronephrosis. No renal or ureteral stones are identified, though evaluation for stones is limited given contrast in the renal calyces. No perinephric stranding is seen. Stomach/Bowel: The stomach is unremarkable in appearance. The small bowel is within normal limits. The appendix is normal in caliber, without evidence of appendicitis. The colon is unremarkable in appearance. Vascular/Lymphatic: Scattered calcification is seen along the abdominal aorta and its branches. The abdominal aorta is otherwise grossly unremarkable. A circumaortic left renal vein is noted. The inferior vena cava is grossly unremarkable. No retroperitoneal lymphadenopathy is seen. No pelvic sidewall lymphadenopathy is identified. Reproductive: The bladder is partially filled with contrast and grossly unremarkable. The uterus is grossly unremarkable, with an intrauterine device noted in expected position at the fundus of the uterus. No suspicious adnexal masses are seen. Other: No additional soft tissue abnormalities are seen. A right femoral venous line is noted. Musculoskeletal: No acute osseous abnormalities are identified. Facet disease is noted at the lower lumbar spine. The visualized musculature is unremarkable in appearance. IMPRESSION: 1. No acute abnormality seen within the abdomen or pelvis. No evidence of retroperitoneal hematoma. 2. Bibasilar atelectasis noted. 3. Scattered aortic atherosclerosis. Electronically Signed   By: Roanna Raider M.D.   On: 04/28/2016 00:05    Cardiac Studies   Procedures   Coronary/Graft Acute MI Revascularization  Left Heart Cath and Coronary Angiography  Conclusion    Acute coronary syndrome with stuttering anteroapical ST elevation myocardial infarction. Pain-free at onset of procedure.  99%  thrombotic mid LAD stenosis due to plaque rupture as a culprit for the presentation. There is also 50% distal LAD stenosis.  30% proximal RCA.  Anteroapical severe hypokinesis/akinesis with EF 40-45%. EDP is elevated suggesting acute left ventricular heart failure.  PCI with stenting of the LAD after aspiration thrombectomy reducing the 99% stenosis to 0% with TIMI grade 3 flow. A 2.75 x 20 Promus Premier was post dilated to 3.0 mm in diameter.  Perclose used for hemostasis and the femoral.  Venous sheath sewn in place.  RECOMMENDATIONS:   Aspirin and Brilinta  Bed rest for at least 4 hours after venous sheath is removed  Risk factor modification    Patient Profile  56 y.o. 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 Troponin positive and dynamic Ecg changes. Ongoing chest pain despite medical therapy. Referred for urgent cardiac cath.  Assessment & Plan    1. NSTEMI. Troponin peak 31.3. Cath findings of critical thrombotic lesion in mid LAD. s/p successful thrombectomy and stenting with DES. EF 40-45% at cath. No other obstructive CAD.  -- On DAPT with ASA and Brilinta. Added low dose beta blocker and ACEi as BP yesterday, but blood pressure soft this morning, therefore held. Needs to work with cardiac rehab today. Will follow up blood pressure this afternoon.  -- difficult IV access, but now femoral sheath out, and IV in place Counseled on smoking cessation.  2. Severe back pain/spasms. History of inflammatory arthritis. Has chronic issues with this but exacerbated by acute illness and need to be at bed rest supine. Improved today. --  Patient reports being followed in a pain clinic in the past.   3. DM A1c  8.2%. Metformin on hold post cath. Resume at time of Dc Needs close follow up with primary care to optimize glucose control  4. Hypercholesterolemia. Intolerant of multiple meds.  -- refer to lipid clinic post DC to assess options. ? PCSK 9 inhibitor.   6. Acute systolic CHF. EF 40-45% acutely. Echo pending. EDP elevated at cath but no overt symptoms of CHF. Add beta blocker and ACEi and titrate as tolerated. Repeat Echo in 3 months.   7. History of ADHD/ major depression  8. History of Factor 2 clotting disorder.  Signed, Laverda Page, NP  04/29/2016, 10:48 AM   Patient seen and examined and history reviewed. Agree with above findings and plan. Patient is feeling well this am. Noted to be hypotensive - Coreg and lisinopril held. Has not been ambulatory. Now walking with Cardiac Rehab. Insists that she wants to go home today. Back pain is better. Will ambulate in halls today. Monitor BP. If stable she can be DC later today with close follow up in our [place] office. Continue DAPT. Smoking cessation.  Peter [place], MDFACC 04/29/2016 11:13 AM

## 2016-04-29 NOTE — Progress Notes (Signed)
Discharge instructions given to patient.  Patient voices understanding and signed discharge instructions.  Patient rolled out to car by RN and discharged home with sister.

## 2016-05-03 ENCOUNTER — Emergency Department
Admission: EM | Admit: 2016-05-03 | Discharge: 2016-05-03 | Discharge status: Against Medical Advice | Payer: Medicare Other | Attending: Emergency Medicine | Admitting: Emergency Medicine

## 2016-05-03 ENCOUNTER — Emergency Department: Payer: Medicare Other

## 2016-05-03 ENCOUNTER — Encounter: Payer: Self-pay | Admitting: Emergency Medicine

## 2016-05-03 DIAGNOSIS — F17200 Nicotine dependence, unspecified, uncomplicated: Secondary | ICD-10-CM | POA: Diagnosis not present

## 2016-05-03 DIAGNOSIS — Z7982 Long term (current) use of aspirin: Secondary | ICD-10-CM | POA: Diagnosis not present

## 2016-05-03 DIAGNOSIS — F909 Attention-deficit hyperactivity disorder, unspecified type: Secondary | ICD-10-CM | POA: Insufficient documentation

## 2016-05-03 DIAGNOSIS — R0789 Other chest pain: Secondary | ICD-10-CM | POA: Diagnosis present

## 2016-05-03 DIAGNOSIS — E119 Type 2 diabetes mellitus without complications: Secondary | ICD-10-CM | POA: Diagnosis not present

## 2016-05-03 DIAGNOSIS — Z9189 Other specified personal risk factors, not elsewhere classified: Secondary | ICD-10-CM

## 2016-05-03 DIAGNOSIS — I2510 Atherosclerotic heart disease of native coronary artery without angina pectoris: Secondary | ICD-10-CM | POA: Diagnosis not present

## 2016-05-03 DIAGNOSIS — R0602 Shortness of breath: Secondary | ICD-10-CM | POA: Diagnosis not present

## 2016-05-03 NOTE — ED Provider Notes (Signed)
[facility] Emergency Department Provider Note   ____________________________________________   First MD Initiated Contact with Patient 05/03/16 1857     (approximate)  I have reviewed the triage vital signs and the nursing notes.   HISTORY  Chief Complaint Chest Pain   HPI Melanie Irwin is a 56 y.o. female who presents to the emergency department for evaluation of chest pain.Chest pain started approximately 5 minutes prior to arrival. She recently had an MI with PCI at [facility]. Pain is different than her previous MI. She states that today she has some shortness of breath and pain in her back. She describes the pain as more of a "pressure." She also has some vague complaints of facial and hand swelling. She states this pressure comes in " twinges." She has not used any nitroglycerin or other medications since the onset. She states that she was forced to come here by her sister who is a Librarian, academic. She states she will only accept a central line for IV and blood draws due to being a "hard stick."   Past Medical History:  Diagnosis Date  . ADHD   . Ankylosing spondylitis (HCC)   . Arthritis   . Birth defect    Spinal cord  . CAD (coronary artery disease), native coronary artery    04/27/16 PCI DES--> LAD, EF 45%  . Depression   . Diabetes mellitus (HCC)   . Factor II deficiency (HCC)   . Hashimoto's thyroiditis   . History of spinal cord injury   . Hyperlipidemia   . Inflammatory arthritis   . Renal disorder   . Scoliosis   . Spinal stenosis     Patient Active Problem List   Diagnosis Date Noted  . ACS (acute coronary syndrome) (HCC) 04/27/2016  . NSTEMI (non-ST elevated myocardial infarction) (HCC) 04/27/2016  . Depression 04/27/2016  . Inflammatory arthritis 04/27/2016  . Hyperlipidemia LDL goal <70 04/27/2016  . Clotting disorder (HCC) 04/27/2016  . Severe recurrent major depression without psychotic features (HCC) 12/03/2015  .  Ankylosing spondylitis (HCC) 12/03/2015  . Tobacco use disorder 12/03/2015  . Suicide attempt (HCC) 12/01/2015  . Diabetes (HCC) 12/01/2015    Past Surgical History:  Procedure Laterality Date  . CHOLECYSTECTOMY    . CORONARY/GRAFT ACUTE MI REVASCULARIZATION N/A 04/27/2016   Procedure: Coronary/Graft Acute MI Revascularization;  Surgeon: Lyn Records, MD;  Location: [facility] INVASIVE CV LAB;  Service: Cardiovascular;  Laterality: N/A;  . LEFT HEART CATH AND CORONARY ANGIOGRAPHY N/A 04/27/2016   Procedure: Left Heart Cath and Coronary Angiography;  Surgeon: Lyn Records, MD;  Location[facility] INVASIVE CV LAB;  Service: Cardiovascular;  Laterality: N/A;  . TUBAL LIGATION      Prior to Admission medications   Medication Sig Start Date End Date Taking? Authorizing Provider  amphetamine-dextroamphetamine (ADDERALL) 20 MG tablet Take 30 mg by mouth 2 (two) times daily.    Historical Provider, MD  aspirin 81 MG chewable tablet Chew 1 tablet (81 mg total) by mouth daily. 04/30/16   Arty Baumgartner, NP  carvedilol (COREG) 3.125 MG tablet Take 1 tablet (3.125 mg total) by mouth 2 (two) times daily with a meal. 04/29/16   Arty Baumgartner, NP  gabapentin (NEURONTIN) 600 MG tablet Take 600 mg by mouth 3 (three) times daily.     Historical Provider, MD  lisinopril (PRINIVIL,ZESTRIL) 2.5 MG tablet Take 1 tablet (2.5 mg total) by mouth daily. 04/30/16   Arty Baumgartner, NP  metFORMIN (GLUCOPHAGE)  500 MG tablet Take 1 tablet (500 mg total) by mouth 2 (two) times daily with a meal. 12/04/15   Jolanta B Pucilowska, MD  niacin (NIASPAN) 500 MG CR tablet Take 500 mg by mouth daily.    Historical Provider, MD  nitroGLYCERIN (NITROSTAT) 0.4 MG SL tablet Place 1 tablet (0.4 mg total) under the tongue every 5 (five) minutes x 3 doses as needed for chest pain. 04/29/16   Arty Baumgartner, NP  sitaGLIPtin (JANUVIA) 100 MG tablet Take 100 mg by mouth daily.    Historical Provider, MD  ticagrelor (BRILINTA) 90 MG TABS tablet  Take 1 tablet (90 mg total) by mouth 2 (two) times daily. 04/29/16   Arty Baumgartner, NP  tiZANidine (ZANAFLEX) 4 MG tablet Take 4 mg by mouth every 8 (eight) hours as needed for muscle spasms.     Historical Provider, MD    Allergies Penicillins; Tramadol; Vancomycin; Haldol [haloperidol]; and Statins  Family History  Problem Relation Age of Onset  . Hypertension Mother   . Lung cancer Father   . Clotting disorder Brother   . Clotting disorder Daughter   . Heart disease Maternal Grandmother     in her 47's unclear type    Social History Social History  Substance Use Topics  . Smoking status: Current Every Day Smoker  . Smokeless tobacco: Never Used     Comment: >20 years   . Alcohol use No    Review of Systems  Constitutional: No fever/chills Eyes: No visual changes. ENT: No sore throat. Cardiovascular: Positive for chest pressure Respiratory: Positive for shortness of breath. Gastrointestinal: No abdominal pain.  No nausea, no vomiting. Positive for diarrhea. Genitourinary: Negative for dysuria. Musculoskeletal: Positive for back pain. Skin: Negative for rash. Negative for diaphoresis. Neurological: Negative for headaches, focal weakness or numbness. Psychiatric: Positive for anxiety ____________________________________________   PHYSICAL EXAM:  VITAL SIGNS: ED Triage Vitals  Enc Vitals Group     BP 05/03/16 1821 119/75     Pulse --      Resp 05/03/16 1821 16     Temp 05/03/16 1821 97.8 F (36.6 C)     Temp Source 05/03/16 1821 Oral     SpO2 05/03/16 1821 97 %     Weight 05/03/16 1819 161 lb (73 kg)     Height 05/03/16 1819 5\' 2"  (1.575 m)     Head Circumference --      Peak Flow --      Pain Score 05/03/16 1818 8     Pain Loc --      Pain Edu? --      Excl. in GC? --     Constitutional: Alert and oriented. Well appearing and in no acute distress. Eyes: Conjunctivae are normal. PERRL. EOMI. Head: Atraumatic. Nose: No  congestion/rhinnorhea. Mouth/Throat: Mucous membranes are moist. Neck: No stridor.   Cardiovascular: Normal rate, regular rhythm. Grossly normal heart sounds.  Good peripheral circulation. Respiratory: Normal respiratory effort.  No retractions. Lungs CTAB. Gastrointestinal: Soft and nontender. No distention. No abdominal bruits. No CVA tenderness. Musculoskeletal: No lower extremity tenderness nor edema.  No joint effusions. Neurologic:  Normal speech and language. No gross focal neurologic deficits are appreciated. No gait instability. Skin:  Skin is warm, dry and intact. No rash noted. Psychiatric: Anxious, rapid speech.  ____________________________________________   LABS (all labs ordered are listed, but only abnormal results are displayed)  Labs Reviewed  BASIC METABOLIC PANEL  CBC  TROPONIN I  HEPATIC FUNCTION PANEL  ____________________________________________  EKG  Sinus rhythm with a ventricular rate of 95 bpm, PR interval is 0.10 with a QRS of 0.08. QT segment is 0.324. No STEMI ____________________________________________  RADIOLOGY  Chest x-ray negative for acute cardiopulmonary abnormality per radiology. ____________________________________________   PROCEDURES  Procedure(s) performed: None  Procedures  Critical Care performed: No  ____________________________________________   INITIAL IMPRESSION / ASSESSMENT AND PLAN / ED COURSE  Pertinent labs & imaging results that were available during my care of the patient were reviewed by me and considered in my medical decision making (see chart for details).  56 year old female presenting to the emergency department for evaluation of chest pressure. It was very difficult to keep the patient on track to discuss her reason for coming to the emergency department with the exception of some pressure in her chest that she also felt in her lower back. She has some random reports of diarrhea since her discharge from  [facility] on 04/27/2016, but states that that is not the reason that she came here today. She states that she was "forced" to come here by her sister who she lives with. She reports that her sister is a Development worker, community. Patient refused to allow the IV team to attempt to use veins that seemed most appropriate for IV access. She was repetitively slapping the back of her hands and stating that the veins will pop up and then disappear, which is why she will not allow anyone to stick those veins.   I sat down and discussed with the patient the importance of allowing the staff to use veins as they feel appropriate. She was advised that she does not need a central line at this time and one will not be placed for lab draw. She became angry and began cursing, tore her gown and cardiac leads off and said "I'm leaving this fucking place." She was asked if she understood that she may die if she is having a heart attack that was not detected on her EKG and she replied with "fuck you" as she was getting out of the bed and getting her clothes. I asked her if she was suicidal and she stated that she was not suicidal, but is tired of being poked all her life and just didn't want to be in the hospital. I asked a second time if she realized that she could die if she left and she said, "I don't fucking care." So, I asked her if that meant she was suicidal and she yelled "no" and left the room.  ____________________________________________   FINAL CLINICAL IMPRESSION(S) / ED DIAGNOSES  Final diagnoses:  At risk for elopement from healthcare setting      NEW MEDICATIONS STARTED DURING THIS VISIT:  Discharge Medication List as of 05/03/2016  7:33 PM       Note:  This document was prepared using Dragon voice recognition software and may include unintentional dictation errors.    Chinita Pester, FNP 05/03/16 2032    Jeanmarie Plant, MD 05/03/16 2037

## 2016-05-03 NOTE — ED Notes (Signed)
IV team came to start IV and pt refused to let them use the veins that they felt were able to be accessed - pt demanding a central line - provider Catha Gosselin NP notified

## 2016-05-03 NOTE — ED Notes (Signed)
Provider went to speak with pt about starting an IV and pt became belligerent and tore all of her monitors off and climbed out the end of the bed and stated that she was leaving this "fucking place" and that she did not care what anyone said about it - provider explained the danger of leaving without treatment and the potential for heart attack that was not detected on EKG - pt continued to yell and be belligerent and get dressed - pt stated she was tired of being in the hospital and that she hoped she died in her sleep - pt then denied suicidal ideation or plan - pt signed out AMA in front of this nurse and provider and stormed out of room

## 2016-05-03 NOTE — ED Triage Notes (Signed)
C/O mid chest pain and swelling today.  Chest pain began approximately 5 min PTA.  Patient with recent MI.

## 2016-05-03 NOTE — ED Notes (Signed)
Pt states she had an MI last week at [facility] and was discharged from the hospital on the 26th - pt states that today she started with swelling in face and hands, shortness of breath (pt respirations are even and unlabored and she does not appear to be short of breath at this time), and chest pain - chest pain is over left breast and radiates into back - pt reports that it feels like pressure - denies N/V - pt c/o feeling tired

## 2016-05-03 NOTE — ED Notes (Addendum)
Pt refuses to stop using cell phone to answer questions or to be placed on monitors - she states she needs to type and will not pay attention to care trying to be given - advised pt that IV team would be coming to start IV and draw blood - she states she used to be on the IV team and that no one is going to be able to get a line on her and that she was told if she had to come back they would just start a central line - pt advised this nurse that she refused to be stuck repeatedly - will advise Dr Alphonzo Lemmings

## 2016-05-03 NOTE — ED Provider Notes (Signed)
-----------------------------------------   8:17 PM on 05/03/2016 -----------------------------------------  Patient presents today with complaints of chest pain, according to notes. She was seen by the nurse practitioner. She was adamant that only certain veins be used for IV access and she allowed [place] to try only 2 IV sticks. According to nursing, there was an excellent vein that they thought they could easily get but the patient refused to let them try that, she would not explain why, instead she insisted we try other veins that that nurses thought were less viable prospects. This was apparently explained to her. Patient was on the phone the entire time according to nursing staff and was rude and abusive. She then it is reported to me demanded a central line. I was on the way to talk to her about this when she pulled off all of her leads and began to curse and swear at the staff and demand to leave AMA. She was not suicidal. I did not actually get a chance to interact with this patient because she elected to leave prior to any further intervention. She understood the risks benefits and alternatives to treatment according to the nurse practitioner who was in the room. Please see her note.     Jeanmarie Plant, MD 05/03/16 2021

## 2016-05-14 ENCOUNTER — Ambulatory Visit: Payer: Self-pay | Admitting: Cardiovascular Disease

## 2016-06-03 ENCOUNTER — Ambulatory Visit (INDEPENDENT_AMBULATORY_CARE_PROVIDER_SITE_OTHER): Payer: Medicare Other | Admitting: Internal Medicine

## 2016-06-03 ENCOUNTER — Encounter: Payer: Self-pay | Admitting: Internal Medicine

## 2016-06-03 VITALS — BP 112/80 | HR 90 | Ht 63.0 in | Wt 153.5 lb

## 2016-06-03 DIAGNOSIS — E785 Hyperlipidemia, unspecified: Secondary | ICD-10-CM | POA: Diagnosis not present

## 2016-06-03 DIAGNOSIS — L819 Disorder of pigmentation, unspecified: Secondary | ICD-10-CM

## 2016-06-03 DIAGNOSIS — R0989 Other specified symptoms and signs involving the circulatory and respiratory systems: Secondary | ICD-10-CM | POA: Diagnosis not present

## 2016-06-03 DIAGNOSIS — I25118 Atherosclerotic heart disease of native coronary artery with other forms of angina pectoris: Secondary | ICD-10-CM

## 2016-06-03 DIAGNOSIS — I255 Ischemic cardiomyopathy: Secondary | ICD-10-CM

## 2016-06-03 MED ORDER — ROSUVASTATIN CALCIUM 5 MG PO TABS: 5.0000 mg | ORAL_TABLET | ORAL | 3 refills | Status: AC

## 2016-06-03 MED ORDER — LOSARTAN POTASSIUM 25 MG PO TABS: 12.5000 mg | ORAL_TABLET | Freq: Every day | ORAL | 3 refills | Status: AC

## 2016-06-03 MED ORDER — CARVEDILOL 6.25 MG PO TABS: 6.2500 mg | ORAL_TABLET | Freq: Two times a day (BID) | ORAL | 3 refills | Status: AC

## 2016-06-03 NOTE — Progress Notes (Signed)
Follow-up Outpatient Visit Date: 06/03/2016  Primary Care Provider: Patient, No Pcp Per No address on file  Chief Complaint: Follow-up NSTEMI  HPI:  Melanie Irwin is a 56 y.o. year-old female with history of CAD admitted with NSTEMI in 04/2016 and found to have subtotal occlusion of mid LAD now s/p PCI, DM, HLD, depression, Factor II deficiency, and ongoing tobacco use, who presents for follow-up of coronary artery disease. She was hospitalized from 4/24-4/26 due to chest pain and NSTEMI. Her post-PCI hospital course was complicated by severe back pain, which has been a chronic problem for Melanie Irwin. CT abdomen/pelvis did not show evidence of retroperitoneal bleed or other acute pathology to explain her pain.  Today, Melanie Irwin reports intermittent palpitations, which she describes as her heart skipping beats or beating fast for a few seconds at a time. She was evaluated by Dr. Juel Burrow yesterday and is scheduled for Holter monitor placement later this week. She also reports occasional chest pain that is non-exertional and migrates throughout the chest. It occurs about once a day and typically lasts less than 1 minute. She denies accompanying symptoms. At other times, she has experienced mild shortness of breath with an accompanying uncomfortable sensation in her neck. She has not had any pain similar to the MI last month.  Melanie Irwin has noticed that her legs appear mottles, particularly involving the thighs and upper calves. She notes that her calves "ache all the time" and that she experiences intermittent numbness in both feet. She has not undergone peripheral vascular evaluation in the past. She wonders if her mottled skin could be due to medications, including niacin that was started in the past due to statin intolerance.  The patient was also diagnosed with a UTI this week and is currently on ciprofloxacin. She is compliant with her cardiac medications, including DAPT with aspirin and ticagrelor.  She has reports a non-productive cough since leaving the hospital (she was started on lisinopril following her MI). She also reports "itching everywhere" for the last 3 weeks.  --------------------------------------------------------------------------------------------------  Cardiovascular History & Procedures: Cardiovascular Problems:  Coronary artery disease with NSTEMI s/p PCI in 04/2016  Ischemic cardiomyopathy  Risk Factors:  Known CAD, DM, hyperlipidemia, and tobacco use  Cath/PCI:  LHC/PCI (04/27/16): LMCA normal. LAD with 99% mid stenosis and 50% distal lesion. LCx normal. RCA with 30% proximal stenosis. Successful PCI to mid LAD with placement of Promus Premier 2.75 x 20 mm drug-eluting stent.  CV Surgery:  None  EP Procedures and Devices:  None  Non-Invasive Evaluation(s):  TTE (04/28/16): Normal LV size and wall thickness with mildly reduced contraction (LVEF 45-50%). There is mid anteroseptal, apical septal, and apical akinesis. Grade 1 diastolic dysfunction. Normal RV size and function. No significant valvular abnormalities.  Recent CV Pertinent Labs: Lab Results  Component Value Date   CHOL 208 (H) 04/27/2016   HDL 54 04/27/2016   LDLCALC 130 (H) 04/27/2016   TRIG 118 04/27/2016   CHOLHDL 3.9 04/27/2016   INR 1.22 04/27/2016   K 3.2 (L) 04/28/2016   BUN 6 04/28/2016   CREATININE 0.49 04/28/2016    Past medical and surgical history were reviewed and updated in EPIC.  Outpatient Encounter Prescriptions as of 06/03/2016  Medication Sig  . amphetamine-dextroamphetamine (ADDERALL) 20 MG tablet Take 30 mg by mouth 2 (two) times daily.  Marland Kitchen aspirin 81 MG chewable tablet Chew 1 tablet (81 mg total) by mouth daily.  . carvedilol (COREG) 3.125 MG tablet Take 1 tablet (3.125 mg  total) by mouth 2 (two) times daily with a meal.  . ciprofloxacin (CIPRO) 500 MG tablet Take 500 mg by mouth 2 (two) times daily.  Marland Kitchen gabapentin (NEURONTIN) 600 MG tablet Take 600 mg by mouth  3 (three) times daily.   Marland Kitchen lisinopril (PRINIVIL,ZESTRIL) 2.5 MG tablet Take 1 tablet (2.5 mg total) by mouth daily.  . metFORMIN (GLUCOPHAGE) 500 MG tablet Take 1 tablet (500 mg total) by mouth 2 (two) times daily with a meal.  . niacin (NIASPAN) 500 MG CR tablet Take 500 mg by mouth daily.  . nitroGLYCERIN (NITROSTAT) 0.4 MG SL tablet Place 1 tablet (0.4 mg total) under the tongue every 5 (five) minutes x 3 doses as needed for chest pain.  . sitaGLIPtin (JANUVIA) 100 MG tablet Take 100 mg by mouth daily.  . ticagrelor (BRILINTA) 90 MG TABS tablet Take 1 tablet (90 mg total) by mouth 2 (two) times daily.  Marland Kitchen tiZANidine (ZANAFLEX) 4 MG tablet Take 4 mg by mouth every 8 (eight) hours as needed for muscle spasms.    No facility-administered encounter medications on file as of 06/03/2016.     Allergies: Penicillins; Tramadol; Vancomycin; Haldol [haloperidol]; and Statins  Social History   Social History  . Marital status: Divorced    Spouse name: N/A  . Number of children: N/A  . Years of education: N/A   Occupational History  . Retired cardiac nurse/disabled    Social History Main Topics  . Smoking status: Current Every Day Smoker    Packs/day: 1.00    Types: Cigarettes  . Smokeless tobacco: Never Used     Comment: >20 years   . Alcohol use No  . Drug use: No  . Sexual activity: Not on file   Other Topics Concern  . Not on file   Social History Narrative  . No narrative on file    Family History  Problem Relation Age of Onset  . Hypertension Mother   . Lung cancer Father   . Clotting disorder Brother   . Clotting disorder Daughter   . Heart disease Maternal Grandmother        in her 70's unclear type    Review of Systems: A 12-system review of systems was performed and was negative except as noted in the HPI.  --------------------------------------------------------------------------------------------------  Physical Exam: BP 112/80 (BP Location: Right Arm,  Patient Position: Sitting, Cuff Size: Normal)   Pulse 90   Ht 5\' 3"  (1.6 m)   Wt 153 lb 8 oz (69.6 kg)   BMI 27.19 kg/m   General:  Well-developed, well-nourished woman, seated in the exam room. She appears somewhat anxious with pressures speech and poor eye contact. HEENT: No conjunctival pallor or scleral icterus.  Moist mucous membranes.  OP clear. Neck: Supple without lymphadenopathy, thyromegaly, JVD, or HJR.  No carotid bruit. Lungs: Normal work of breathing.  Clear to auscultation bilaterally without wheezes or crackles. Heart: Regular rate and rhythm without murmurs, rubs, or gallops.  Non-displaced PMI. Abd: Bowel sounds present.  Soft, NT/ND without hepatosplenomegaly Ext: No lower extremity edema.  2+ radial and trace pedal pulses bilaterally. Skin: Mottled appearance of distal thighs and knees bilaterally.  EKG:  NSR with anterior T-wave inversions, new since 05/03/16. Previously noted anterior Q-waves are no longer evident.  Lab Results  Component Value Date   WBC 8.8 04/29/2016   HGB 12.5 04/29/2016   HCT 35.4 (L) 04/29/2016   MCV 89.8 04/29/2016   PLT 174 04/29/2016    Lab Results  Component Value Date   NA 137 04/28/2016   K 3.2 (L) 04/28/2016   CL 103 04/28/2016   CO2 26 04/28/2016   BUN 6 04/28/2016   CREATININE 0.49 04/28/2016   GLUCOSE 125 (H) 04/28/2016   ALT 99 (H) 04/27/2016    Lab Results  Component Value Date   CHOL 208 (H) 04/27/2016   HDL 54 04/27/2016   LDLCALC 130 (H) 04/27/2016   TRIG 118 04/27/2016   CHOLHDL 3.9 04/27/2016   --------------------------------------------------------------------------------------------------  ASSESSMENT AND PLAN: Coronary artery disease with stable angina Patient has recovered well from high-risk NSTEMI last month with PCI to the mid LAD. Her intermittent non-exertional chest pain sounds quite atypical. We will increase carvedilol for improved BP control and anti-anginal therapy. We will continue DAPT with  ASA and ticagrelor for 12 months from MI in 04/2016. We will add low-dose statin therapy, as below. We will defer cardiac rehab until completion of vascular studies.  Ischemic cardiomyopathy Mildly reduced LV function noted by echo following NSTEMI. We will increase carvedilol to 6.25 mg BID and switch lisinopril to losartan due to cough. Patient appears euvolemic on exam today. We will check a BMP in ~2 weeks to ensure stable renal function and potassium with medication changes.  Decreased pedal pulses and mottled extremities Ms. Fenner has constant aching in her calves and also notes mottling of the skin involving both legs. This has been present for several weeks. I have recommended that she stop niacin. We will obtain bilateral LE ABI's to evaluate for significant PVD.  Hyperlipidemia Goal is < 70. Ms. Bittick is currently not on statin therapy due to history of myalgias with multiple agents. We have discussed the importance of lipid control for secondary prevention and have agreed to start rosuvastatin 5 mg on Monday, Wednesday, and Friday. If she is intolerant of this or does not achieve an LDL < 70, we will refer her to the lipid clinic for consideration of PCK9 therapy. I have advised her to stop niacin, given lack of evidence for its use. We will check an ALT in 2 weeks, given mild elevation at the time of MI last month.  Palpitations Currently being evaluated by Dr. Juel Burrow; I will defer evaluation and treatment to him.  Follow-up: Return to clinic in 6 weeks.  Yvonne Kendall, MD 06/05/2016 12:04 PM

## 2016-06-03 NOTE — Patient Instructions (Signed)
Medication Instructions:  Your physician has recommended you make the following change in your medication:  1- STOP TAKING Lisinopril. 2- STOP taking Niacin. 3- INCREASE Carvedilol to 6.25mg  by mouth two times a day. 4- START taking Losartan 1/2 tablet (12.5 mg) by mouth once a day. 5- START taking Crestor 5 mg (1 tablet) by mouth Monday, Wednesday and Friday.   Labwork: Your physician recommends that you return for lab work in: 2 WEEKS (BMP). - Please go to the [facility]. You will check in at the front desk to the right as you walk into the atrium. Valet Parking is offered if needed.    Testing/Procedures: Your physician has requested that you have a lower extremity arterial doppler- During this test, ultrasound is used to evaluate arterial blood flow in the legs. Allow approximately one hour for this exam.   Your physician has requested that you have an ankle brachial index (ABI). During this test an ultrasound and blood pressure cuff are used to evaluate the arteries that supply the arms and legs with blood. Allow thirty minutes for this exam. There are no restrictions or special instructions.    Follow-Up: Your physician recommends that you schedule a follow-up appointment in: 6 WEEKS WITH DR END OR RYAN.   If you need a refill on your cardiac medications before your next appointment, please call your pharmacy.

## 2016-06-08 ENCOUNTER — Telehealth: Payer: Self-pay | Admitting: *Deleted

## 2016-06-08 NOTE — Telephone Encounter (Signed)
Spoke with [employer] Patient has a new plan with ID # [id number]. He said rosuvastatin is covered on patient's plan for 5mg  once a day. However, it was sent to plan as twice a day. He ran the Rx as rosuvastatin 5mg , 1 tablet on Monday, Wednesday and Friday. It is covered on patient's plan but unable to tell me for how much and prior auth is not needed. Test claim # [id number].  Notified Christine at [facility] who noted to run patient's insurance at next refill.

## 2016-06-08 NOTE — Telephone Encounter (Signed)
Received prior auth request from [employer] for rosuvastatin. Called patient's pharmacy who said they gave patient the cash out price of $15. Unsure how much it will be through drug coverage plan at this time. Given the number [phone] to call [employer] to check on drug coverage to see if preferred is brand or generic.

## 2016-06-15 ENCOUNTER — Telehealth: Payer: Self-pay | Admitting: *Deleted

## 2016-06-15 NOTE — Telephone Encounter (Signed)
Received prior authorization request for rosuvastatin from patient's [employer]. Spoke with representative from plan on 06/08/16 (see telephone call) and was told prior auth was not needed.  Called and spoke with patient. She started a new plan on 06/04/16 with [employer]. Recently got Rx filled at cash value of $15. Patient has tried multiple statins in the past including Simvastatin and atorvastatin. She experienced very bad weakness, loss of strength and cramping in legs and arms. She has taken Niacin and experienced hot flashes and it did not help with cholesterol management. Patient was prescribed the statins while under care of a physician in [place] in the past. Patient will call [employer] to make sure rosuvastatin is covered and if prior Berkley Harvey is needed. She will contact us to let us know the outcome.

## 2016-06-17 NOTE — Telephone Encounter (Signed)
Called and spoke with patient. She has not been able to call [employer] yet but she will try to call today and then get back with me.

## 2016-06-24 NOTE — Telephone Encounter (Signed)
S/w patient. She said she found out the rosuvastatin is a Tier 4 and she must have tried other medications prior to gaining approval. Therefore, prior authorization must be completed.  Also, reminded patient we needed repeat BMP as soon as possible. Patient stated she had forgotten all about it and will [facility].

## 2016-07-01 DIAGNOSIS — I259 Chronic ischemic heart disease, unspecified: Secondary | ICD-10-CM | POA: Diagnosis not present

## 2016-07-01 DIAGNOSIS — E889 Metabolic disorder, unspecified: Secondary | ICD-10-CM | POA: Diagnosis not present

## 2016-07-01 DIAGNOSIS — E785 Hyperlipidemia, unspecified: Secondary | ICD-10-CM | POA: Diagnosis not present

## 2016-07-01 DIAGNOSIS — E1169 Type 2 diabetes mellitus with other specified complication: Secondary | ICD-10-CM | POA: Diagnosis not present

## 2016-07-02 ENCOUNTER — Other Ambulatory Visit: Payer: Self-pay | Admitting: Internal Medicine

## 2016-07-02 DIAGNOSIS — R319 Hematuria, unspecified: Secondary | ICD-10-CM

## 2016-07-13 ENCOUNTER — Telehealth: Payer: Self-pay | Admitting: Internal Medicine

## 2016-07-13 ENCOUNTER — Telehealth: Payer: Self-pay | Admitting: *Deleted

## 2016-07-13 NOTE — Telephone Encounter (Signed)
-----   Message from Stann Mainland, RN sent at 07/13/2016  8:33 AM EDT ----- Regarding: Will need ABI's prior to appt on 7/25 [facility],  Sorry to bombard you with appts. This patient no showed for her ABI's and will need them done prior to appt on 7/25 with Dr End if patient is able to. Thanks a million!!  [employer]

## 2016-07-13 NOTE — Telephone Encounter (Signed)
Attempted to contact patient Need to reschedule ABI   Will try again at a later time

## 2016-07-13 NOTE — Telephone Encounter (Signed)
Recevied from [employer] response that "clinical prior authorization is not required on the drug requested." No further action at this time.

## 2016-07-13 NOTE — Telephone Encounter (Signed)
Prior Auth for Rosuvastatin sent via [email or web address]; KEY [id number]. Waiting for approval.

## 2016-07-15 ENCOUNTER — Telehealth: Payer: Self-pay | Admitting: *Deleted

## 2016-07-15 NOTE — Telephone Encounter (Signed)
Thank you for the update. Given subtotally occluded LAD in the setting of acute MI in 04/2016 with drug-eluting stent placement, I agree that it is crucial for Ms. Ponto to restart aspirin and ticagrelor.  Yvonne Kendall, MD [facility] Pager: [phone]

## 2016-07-15 NOTE — Telephone Encounter (Signed)
Patient has not gotten lab work as advised to do mid-June by Dr End after making multiple med changes on 06/03/16. Patient stated she will do it when she can.  I stressed the importance of checking kidney function and she stated, "Well I'm not taking those medications anyways." Patient went on to say that she stopped the Brilinta after only taking it for 3 weeks after discharge along with stopping all her other medications at that time as well. I suggested she had reported she was taking them on 06/03/16 at office visit.  She said she had taken them for a few days leading up to appt and then stopped them again after that. She's stopped her cardiac medications as well as diabetic medications and is not taking anything. We discussed the risks of not taking Brilinta post-PCI and that not taking her prescribed medicaitons could be detrimental to her health. 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 She says PCP won't refer her to pain clinic. I asked if she was having thoughts of suicide or of wanting to hurt or kill herself.  She denied any suicidal ideations at this time saying, "I just don't want to take my medications anymore and if that ends up killing me then, oh well." Advised her to contact PCP to let them know as well. She is aware of upcoming appt with Dr End on 07/28/16. She said she will try to get lab work sometime.  Will route to Dr End to make him aware.

## 2016-07-16 NOTE — Telephone Encounter (Addendum)
No answer. Phone rang once and then sounded like it hung up immediately. Tried a second time and patient answered. Gave her Dr Serita Kyle recommendation and she said ok and verbalized understanding.

## 2016-07-21 ENCOUNTER — Encounter: Payer: Self-pay | Admitting: Internal Medicine

## 2016-07-21 ENCOUNTER — Ambulatory Visit
Admission: RE | Admit: 2016-07-21 | Discharge: 2016-07-21 | Disposition: A | Discharge status: Home / Self Care | Payer: Medicare HMO | Source: Ambulatory Visit | Attending: Internal Medicine | Admitting: Internal Medicine

## 2016-07-21 ENCOUNTER — Ambulatory Visit (INDEPENDENT_AMBULATORY_CARE_PROVIDER_SITE_OTHER): Payer: Medicare HMO | Admitting: Internal Medicine

## 2016-07-21 VITALS — BP 100/80 | HR 99 | Ht 63.0 in | Wt 154.5 lb

## 2016-07-21 DIAGNOSIS — R0989 Other specified symptoms and signs involving the circulatory and respiratory systems: Secondary | ICD-10-CM | POA: Diagnosis not present

## 2016-07-21 DIAGNOSIS — I255 Ischemic cardiomyopathy: Secondary | ICD-10-CM

## 2016-07-21 DIAGNOSIS — I2510 Atherosclerotic heart disease of native coronary artery without angina pectoris: Secondary | ICD-10-CM

## 2016-07-21 DIAGNOSIS — Z91148 Patient's other noncompliance with medication regimen for other reason: Secondary | ICD-10-CM | POA: Insufficient documentation

## 2016-07-21 DIAGNOSIS — Z9114 Patient's other noncompliance with medication regimen: Secondary | ICD-10-CM | POA: Diagnosis not present

## 2016-07-21 NOTE — Progress Notes (Signed)
Follow-up Outpatient Visit Date: 07/21/2016  Primary Care Provider: Corky Downs, MD [address] [place] [address]  Chief Complaint: Follow-up NSTEMI  HPI:  [name] is a 56 y.o. year-old female with history of CAD admitted with NSTEMI in 04/2016 and found to have subtotal occlusion of mid LAD now s/p PCI, DM, HLD, depression, Factor II deficiency, and ongoing tobacco use, who presents for follow-up of coronary artery disease. She was hospitalized from 4/24-4/26 due to chest pain and NSTEMI. Her post-PCI hospital course was complicated by severe back pain, which has been a chronic problem for [name]. CT abdomen/pelvis did not show evidence of retroperitoneal bleed or other acute pathology to explain her pain. I last saw her on 06/03/16, which time we added a low-dose statin in order vascular studies due to diminished pedal pulses and mottled extremities. She also noted migratory nonexertional chest pain and palpitations. She was scheduled for Holter monitor placement with her PCP, Dr. Juel Burrow.  Today, [name] reports that she has been bothered by significant musculoskeletal and neuropathic pain that she attributes to her inflammatory arthritis, ankylosing spondylitis, and multiple sclerosis. Because she feels like her providers have not been adequately managing her pain, she decided to stop all medications with the exception of Adderall and gabapentin. In the past, she used NSAIDs and Zanaflex to manage her chronic pain. However, Zanaflex has not been refilled by her PCP. In the past, she reports having seen a pain specialist, but felt like this did not help her symptoms. She has not seen a neurologist in several decades, per her report. She is not on any treatment for her arthritis or multiple sclerosis.  [name] has not taken dual antiplatelet therapy or any other cardiac medications for several weeks. She has not had any significant chest pain and has stable exertional dyspnea with  gardening or going up a flight of stairs. She has occasional dependent edema that is unchanged. She has not been weighing herself.  --------------------------------------------------------------------------------------------------  Cardiovascular History & Procedures: Cardiovascular Problems:  Coronary artery disease with NSTEMI s/p PCI in 04/2016  Ischemic cardiomyopathy  Risk Factors:  Known CAD, DM, hyperlipidemia, and tobacco use  Cath/PCI:  LHC/PCI (04/27/16): LMCA normal. LAD with 99% mid stenosis and 50% distal lesion. LCx normal. RCA with 30% proximal stenosis. Successful PCI to mid LAD with placement of Promus Premier 2.75 x 20 mm drug-eluting stent.  CV Surgery:  None  EP Procedures and Devices:  None  Non-Invasive Evaluation(s):  TTE (04/28/16): Normal LV size and wall thickness with mildly reduced contraction (LVEF 45-50%). There is mid anteroseptal, apical septal, and apical akinesis. Grade 1 diastolic dysfunction. Normal RV size and function. No significant valvular abnormalities.  Recent CV Pertinent Labs: Lab Results  Component Value Date   CHOL 208 (H) 04/27/2016   HDL 54 04/27/2016   LDLCALC 130 (H) 04/27/2016   TRIG 118 04/27/2016   CHOLHDL 3.9 04/27/2016   INR 1.22 04/27/2016   K 3.2 (L) 04/28/2016   BUN 6 04/28/2016   CREATININE 0.49 04/28/2016    Past medical and surgical history were reviewed and updated in EPIC.  Current Meds  Medication Sig  . amphetamine-dextroamphetamine (ADDERALL) 20 MG tablet Take 30 mg by mouth 2 (two) times daily.  Marland Kitchen gabapentin (NEURONTIN) 600 MG tablet Take 600 mg by mouth 3 (three) times daily.     Allergies: Penicillins; Tramadol; Vancomycin; Haldol [haloperidol]; and Statins  Social History   Social History  . Marital status: Divorced  Spouse name: N/A  . Number of children: N/A  . Years of education: N/A   Occupational History  . Retired cardiac nurse/disabled    Social History Main Topics  .  Smoking status: Current Every Day Smoker    Packs/day: 1.00    Types: Cigarettes  . Smokeless tobacco: Never Used     Comment: >20 years   . Alcohol use No  . Drug use: No  . Sexual activity: Not on file   Other Topics Concern  . Not on file   Social History Narrative  . No narrative on file    Family History  Problem Relation Age of Onset  . Hypertension Mother   . Lung cancer Father   . Clotting disorder Brother   . Clotting disorder Daughter   . Heart disease Maternal Grandmother        in her 55's unclear type    Review of Systems: A 12-system review of systems was performed and was negative except as noted in the HPI.  --------------------------------------------------------------------------------------------------  Physical Exam: BP 100/80 (BP Location: Left Arm, Patient Position: Sitting, Cuff Size: Normal)   Pulse 99   Ht 5\' 3"  (1.6 m)   Wt 154 lb 8 oz (70.1 kg)   BMI 27.37 kg/m   General:  Overweight woman, seated comfortably in the exam room. HEENT: No conjunctival pallor or scleral icterus.  Moist mucous membranes.  OP clear. Neck: Supple without lymphadenopathy, thyromegaly, JVD, or HJR.  No carotid bruit. Lungs: Normal work of breathing.  Clear to auscultation bilaterally without wheezes or crackles. Heart: Regular rate and rhythm without murmurs, rubs, or gallops.  Non-displaced PMI. Abd: Bowel sounds present.  Soft, NT/ND without hepatosplenomegaly Ext: No lower extremity edema.  2+ radial and 1+ pedal pulses bilaterally. Skin: Warm. Mottled skin overlying both knees is similar to prior exam. Psych: Patient is alert and oriented 3. She has a normal mood, denying depression. Her affect is labile. No suicidal thoughts.  EKG:  Normal sinus rhythm with possible left atrial enlargement and nonspecific T-wave changes. Anterior T-wave inversions noted on previous tracing from 06/03/16 are no longer evident.  Lab Results  Component Value Date   WBC 8.8  04/29/2016   HGB 12.5 04/29/2016   HCT 35.4 (L) 04/29/2016   MCV 89.8 04/29/2016   PLT 174 04/29/2016    Lab Results  Component Value Date   NA 137 04/28/2016   K 3.2 (L) 04/28/2016   CL 103 04/28/2016   CO2 26 04/28/2016   BUN 6 04/28/2016   CREATININE 0.49 04/28/2016   GLUCOSE 125 (H) 04/28/2016   ALT 99 (H) 04/27/2016    Lab Results  Component Value Date   CHOL 208 (H) 04/27/2016   HDL 54 04/27/2016   LDLCALC 130 (H) 04/27/2016   TRIG 118 04/27/2016   CHOLHDL 3.9 04/27/2016   --------------------------------------------------------------------------------------------------  ASSESSMENT AND PLAN: Coronary artery disease No significant chest pain and stable dyspnea on exertion from her last exam. Unfortunately, Ms. Scotland has stopped all medical therapy, including dual antiplatelet therapy. I spoke with her at length regarding the importance of taking her medications, particularly aspirin and P2Y12 inhibitor given her NSTEMI in late April with drug-eluting stent placement. Risks of foregoing these medications include heart attack, worsening heart failure, and death. I have recommended that she restart these medications immediately. Ms. Sheck acknowledges my recommendations and the risks of not taking these medications but declines to restart anything at this time.  Ischemic cardiomyopathy Ms. Cicco appears  euvolemic with NYHA class III symptoms. She is not currently on any medical therapy. We discussed the importance of her medications, but she does not wish to restart any at this time.  Decreased pedal pulses and mottled extremities Exam is unchanged. Ms. Goding reports that leg pain has improved. She never proceeded with lower extremity arterial studies and declines to proceed.  Medication noncompliance Ms. Moylan is adamantly opposed to taking any medications other than Adderall and gabapentin, which she states help with her pain. I inquired as to why she would be  unwilling to take critical medications and she responded only saying that they were not helping her pain. She denies depression but states that she would not mind dying from a heart attack as she has lived too many years in pain. She does not have any thoughts of suicide, stating that she is "a good [employer] and does not want to kill herself." I encouraged her to speak with her primary care provider or other counselor. However, Ms. Blecher is adamant that she is not depressed and does not wish to speak with other providers or her family about this. I feel that Ms. Rockhold has capacity to make healthcare decisions on her own, including the right to refuse medications. I asked her to contact us if she reconsiders so that we can restart appropriate cardiac therapy.  Follow-up: Return to clinic in 3 months.  Approximately 45 minutes was spent counseling the patient face-to-face.  Yvonne Kendall, MD 07/21/2016 3:17 PM

## 2016-07-21 NOTE — Patient Instructions (Signed)
Medication Instructions:  Your physician recommends that you continue on your current medications as directed. Please refer to the Current Medication list given to you today.  Dr End recommends you take your Aspirin and Brilinta as well as your other prescribed medications.  Labwork: none  Testing/Procedures: none  Follow-Up: Your physician recommends that you schedule a follow-up appointment in: 3 MONTHS WITH DR END.    If you need a refill on your cardiac medications before your next appointment, please call your pharmacy.

## 2016-07-28 ENCOUNTER — Ambulatory Visit: Payer: Self-pay | Admitting: Internal Medicine

## 2016-10-01 DIAGNOSIS — Z9989 Dependence on other enabling machines and devices: Secondary | ICD-10-CM | POA: Diagnosis not present

## 2016-10-01 DIAGNOSIS — M797 Fibromyalgia: Secondary | ICD-10-CM | POA: Diagnosis not present

## 2016-10-01 DIAGNOSIS — G4733 Obstructive sleep apnea (adult) (pediatric): Secondary | ICD-10-CM | POA: Diagnosis not present

## 2016-10-01 DIAGNOSIS — E785 Hyperlipidemia, unspecified: Secondary | ICD-10-CM | POA: Diagnosis not present

## 2017-01-10 DIAGNOSIS — M797 Fibromyalgia: Secondary | ICD-10-CM | POA: Diagnosis not present

## 2017-01-10 DIAGNOSIS — E1169 Type 2 diabetes mellitus with other specified complication: Secondary | ICD-10-CM | POA: Diagnosis not present

## 2017-01-10 DIAGNOSIS — I259 Chronic ischemic heart disease, unspecified: Secondary | ICD-10-CM | POA: Diagnosis not present

## 2017-01-10 DIAGNOSIS — R000 Tachycardia, unspecified: Secondary | ICD-10-CM | POA: Diagnosis not present

## 2017-04-11 DIAGNOSIS — E119 Type 2 diabetes mellitus without complications: Secondary | ICD-10-CM | POA: Diagnosis not present

## 2017-04-11 DIAGNOSIS — I208 Other forms of angina pectoris: Secondary | ICD-10-CM | POA: Diagnosis not present

## 2017-04-11 DIAGNOSIS — E785 Hyperlipidemia, unspecified: Secondary | ICD-10-CM | POA: Diagnosis not present

## 2017-04-11 DIAGNOSIS — M797 Fibromyalgia: Secondary | ICD-10-CM | POA: Diagnosis not present

## 2017-04-12 DIAGNOSIS — E785 Hyperlipidemia, unspecified: Secondary | ICD-10-CM | POA: Diagnosis not present

## 2017-04-12 DIAGNOSIS — R0789 Other chest pain: Secondary | ICD-10-CM | POA: Diagnosis not present

## 2017-04-12 DIAGNOSIS — E1169 Type 2 diabetes mellitus with other specified complication: Secondary | ICD-10-CM | POA: Diagnosis not present

## 2017-04-12 DIAGNOSIS — R079 Chest pain, unspecified: Secondary | ICD-10-CM | POA: Diagnosis not present

## 2017-04-12 DIAGNOSIS — E119 Type 2 diabetes mellitus without complications: Secondary | ICD-10-CM | POA: Diagnosis not present

## 2017-04-12 DIAGNOSIS — E889 Metabolic disorder, unspecified: Secondary | ICD-10-CM | POA: Diagnosis not present

## 2017-04-12 DIAGNOSIS — M797 Fibromyalgia: Secondary | ICD-10-CM | POA: Diagnosis not present

## 2017-04-12 DIAGNOSIS — D689 Coagulation defect, unspecified: Secondary | ICD-10-CM | POA: Diagnosis not present

## 2017-05-03 DIAGNOSIS — R079 Chest pain, unspecified: Secondary | ICD-10-CM | POA: Diagnosis not present

## 2017-05-03 DIAGNOSIS — G35 Multiple sclerosis: Secondary | ICD-10-CM | POA: Diagnosis not present

## 2017-05-03 DIAGNOSIS — E785 Hyperlipidemia, unspecified: Secondary | ICD-10-CM | POA: Diagnosis not present

## 2017-05-03 DIAGNOSIS — M459 Ankylosing spondylitis of unspecified sites in spine: Secondary | ICD-10-CM | POA: Diagnosis not present

## 2017-05-03 DIAGNOSIS — G4733 Obstructive sleep apnea (adult) (pediatric): Secondary | ICD-10-CM | POA: Diagnosis not present

## 2017-05-20 DIAGNOSIS — R079 Chest pain, unspecified: Secondary | ICD-10-CM | POA: Diagnosis not present

## 2017-05-20 DIAGNOSIS — M1990 Unspecified osteoarthritis, unspecified site: Secondary | ICD-10-CM | POA: Diagnosis not present

## 2017-05-20 DIAGNOSIS — E785 Hyperlipidemia, unspecified: Secondary | ICD-10-CM | POA: Diagnosis not present

## 2017-05-20 DIAGNOSIS — R000 Tachycardia, unspecified: Secondary | ICD-10-CM | POA: Diagnosis not present

## 2017-07-19 DIAGNOSIS — I259 Chronic ischemic heart disease, unspecified: Secondary | ICD-10-CM | POA: Diagnosis not present

## 2017-07-19 DIAGNOSIS — B86 Scabies: Secondary | ICD-10-CM | POA: Diagnosis not present

## 2017-07-19 DIAGNOSIS — M797 Fibromyalgia: Secondary | ICD-10-CM | POA: Diagnosis not present

## 2017-07-19 DIAGNOSIS — E785 Hyperlipidemia, unspecified: Secondary | ICD-10-CM | POA: Diagnosis not present

## 2017-09-04 IMAGING — CT CT ABD-PELV W/O CM
2 of 4 series · 16 of 46 positions shown, 18 images · non-contrast
Comparison: None.

CLINICAL DATA: Acute onset of generalized abdominal and back pain.
Assess for retroperitoneal hematoma. Initial encounter.

EXAM:
CT ABDOMEN AND PELVIS WITHOUT CONTRAST
TECHNIQUE: Multidetector CT imaging of the abdomen and pelvis was performed
following the standard protocol without IV contrast.

[Series 3: abd/ pelvis 5.0 i30f 2 · axial · 0.79mm/px · z∈[+801,+1211]mm · 13 of 90 slices shown, 15 images]
[im 4/90  soft-tissue]
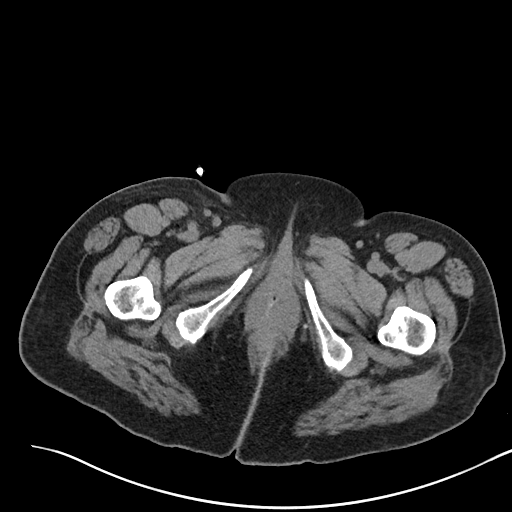
[im 4/90  bone]
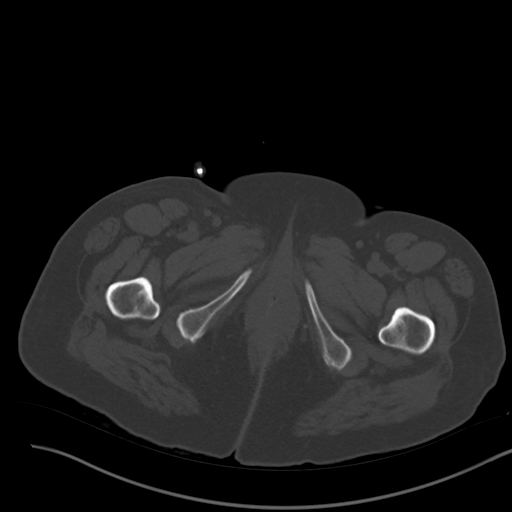
[im 12/90  soft-tissue]
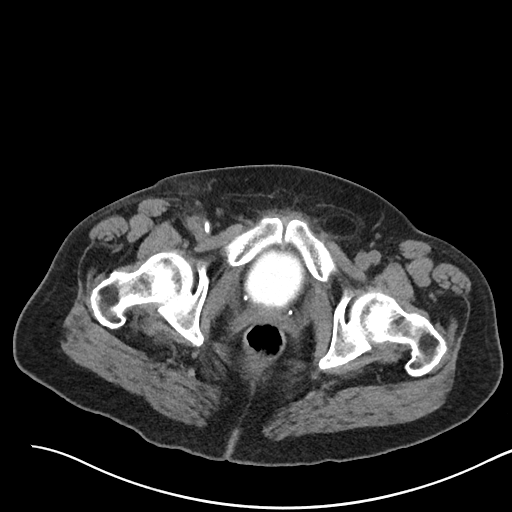
[im 19/90  soft-tissue]
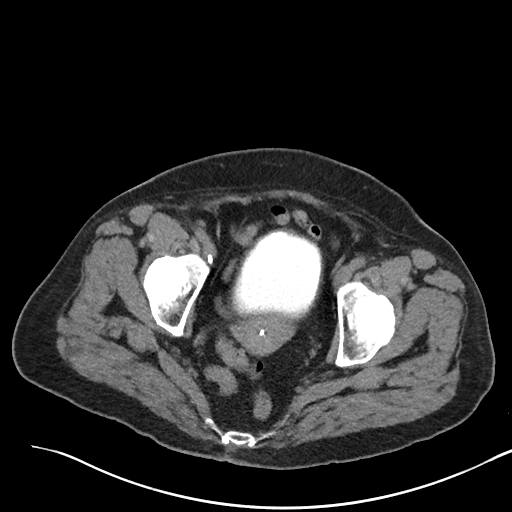
[im 26/90  soft-tissue]
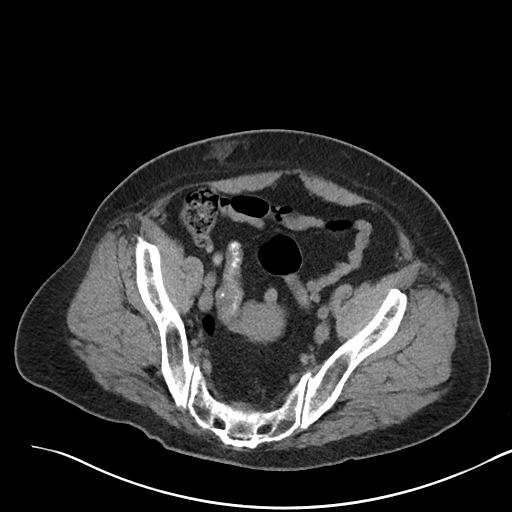
[im 30/90  soft-tissue]
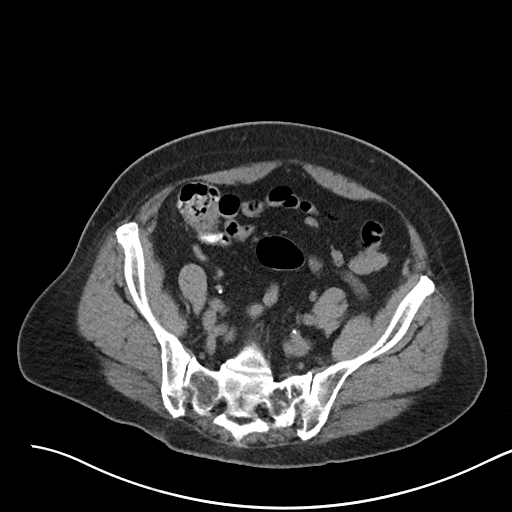
[im 38/90  soft-tissue]
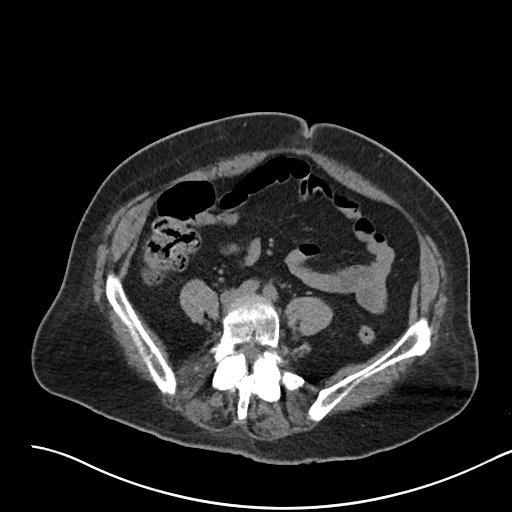
[im 45/90  soft-tissue]
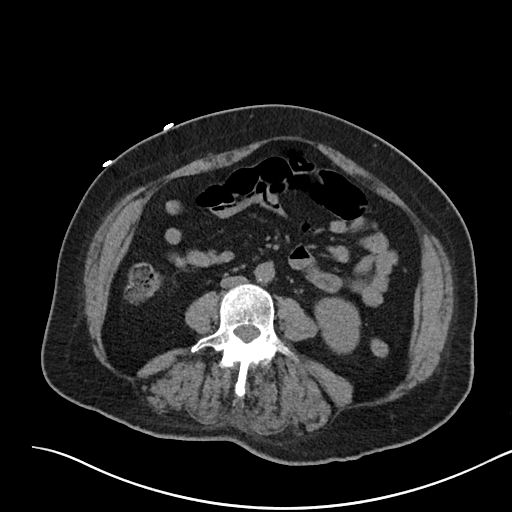
[im 52/90  soft-tissue]
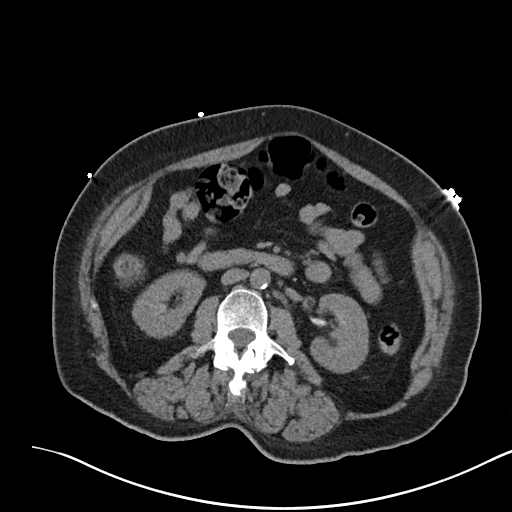
[im 60/90  soft-tissue]
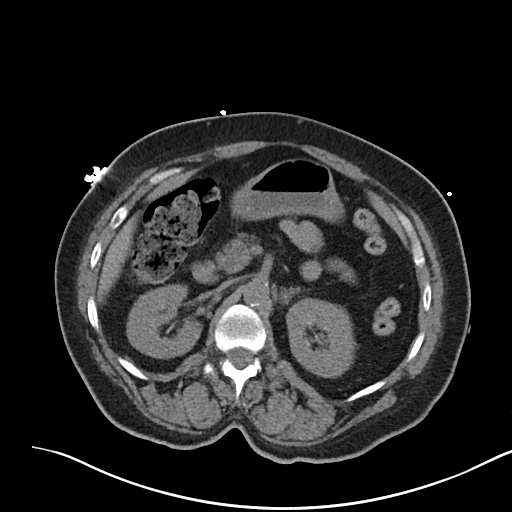
[im 60/90  bone]
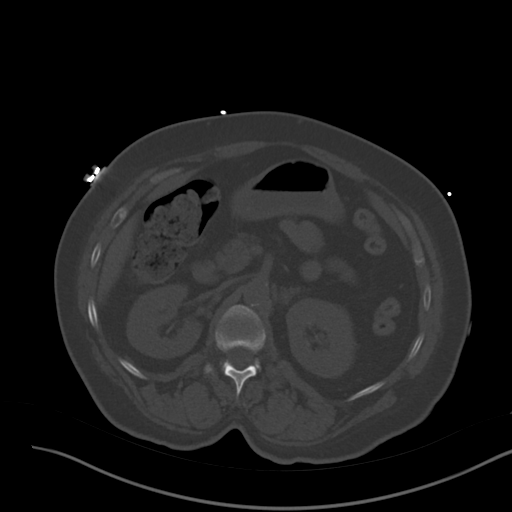
[im 64/90  soft-tissue]
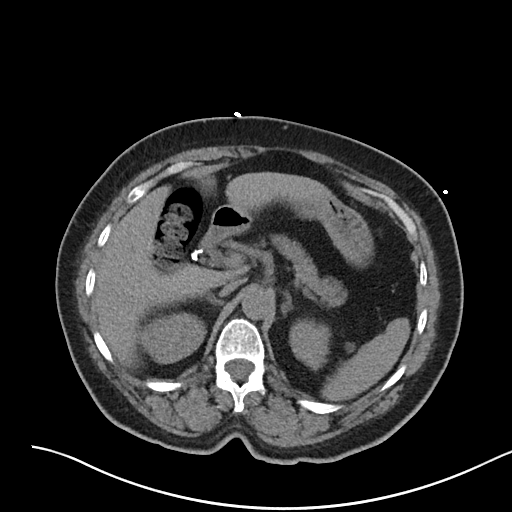
[im 71/90  soft-tissue]
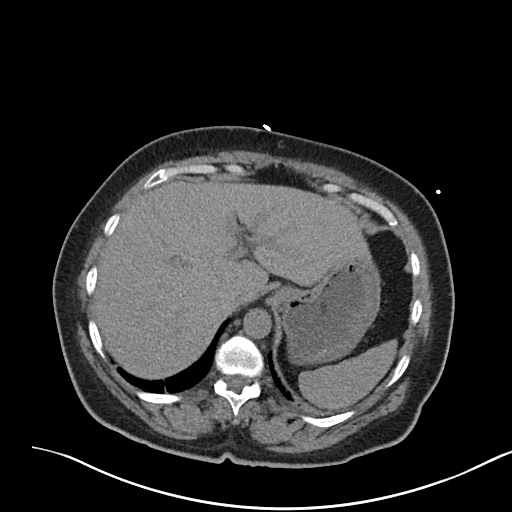
[im 78/90  soft-tissue]
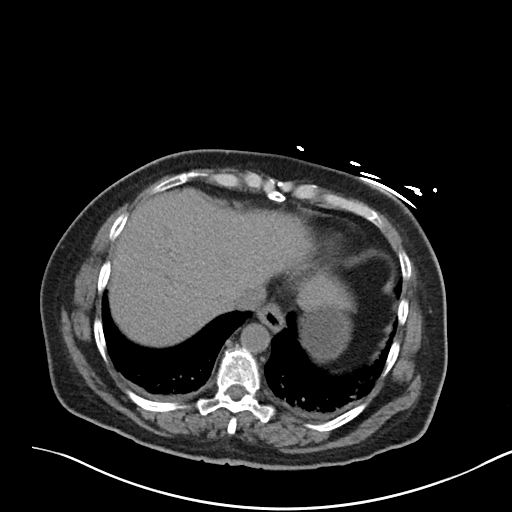
[im 86/90  soft-tissue]
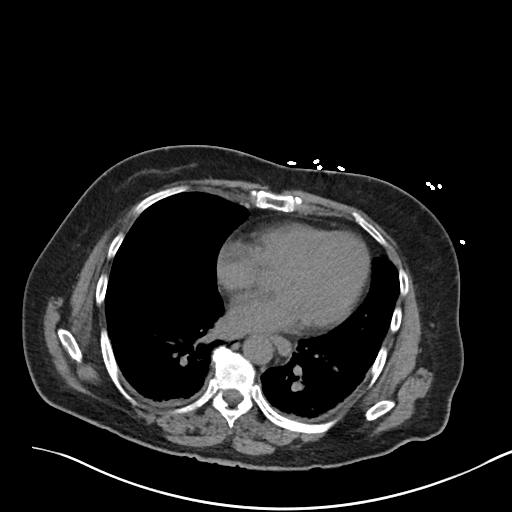

[Series 6: cor st · coronal · 0.75mm/px · 3 of 92 slices shown]
[im 31/92  soft-tissue]
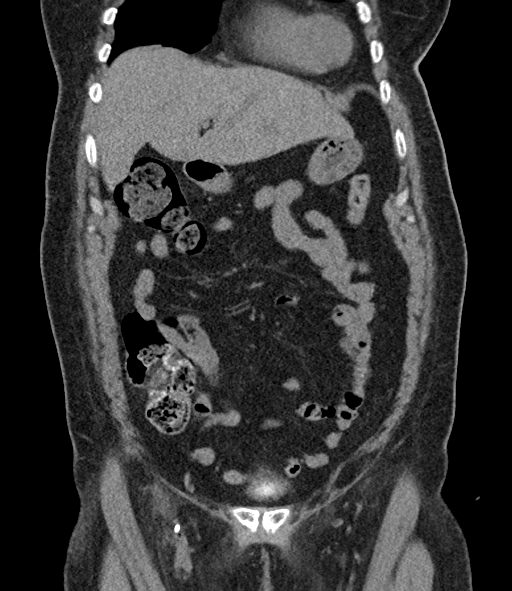
[im 41/92  soft-tissue]
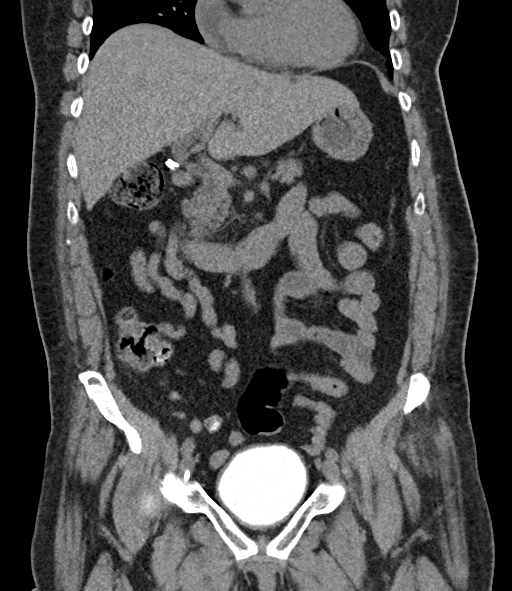
[im 51/92  soft-tissue]
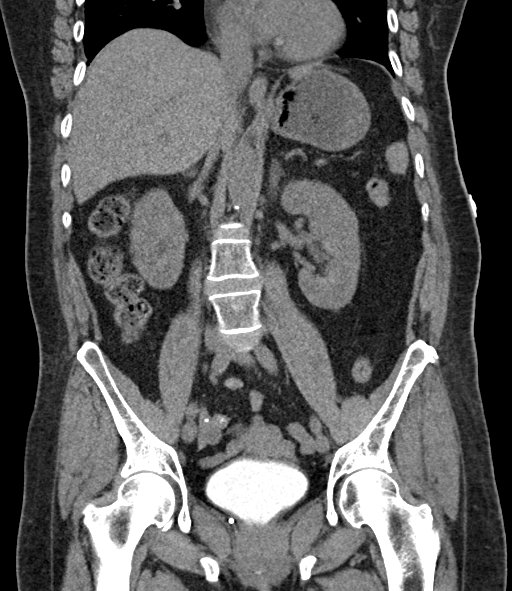

[16 of 46 positions shown; findings below may reference images not displayed]

FINDINGS: Lower chest: Bibasilar atelectasis is noted. The visualized portions
of the mediastinum are unremarkable.

Hepatobiliary: The liver is unremarkable in appearance. The patient
is status post cholecystectomy, with clips noted at the gallbladder
fossa. The common bile duct remains normal in caliber.

Pancreas: The pancreas is within normal limits.

Spleen: The spleen is unremarkable in appearance.

Adrenals/Urinary Tract: The adrenal glands are unremarkable in
appearance. The kidneys are within normal limits. There is no
evidence of hydronephrosis. No renal or ureteral stones are
identified, though evaluation for stones is limited given contrast
in the renal calyces. No perinephric stranding is seen.

Stomach/Bowel: The stomach is unremarkable in appearance. The small
bowel is within normal limits. The appendix is normal in caliber,
without evidence of appendicitis. The colon is unremarkable in
appearance.

Vascular/Lymphatic: Scattered calcification is seen along the
abdominal aorta and its branches. The abdominal aorta is otherwise
grossly unremarkable. A circumaortic left renal vein is noted. The
inferior vena cava is grossly unremarkable. No retroperitoneal
lymphadenopathy is seen. No pelvic sidewall lymphadenopathy is
identified.

Reproductive: The bladder is partially filled with contrast and
grossly unremarkable. The uterus is grossly unremarkable, with an
intrauterine device noted in expected position at the fundus of the
uterus. No suspicious adnexal masses are seen.

Other: No additional soft tissue abnormalities are seen. A right
femoral venous line is noted.

Musculoskeletal: No acute osseous abnormalities are identified.
Facet disease is noted at the lower lumbar spine. The visualized
musculature is unremarkable in appearance.
IMPRESSION: 1. No acute abnormality seen within the abdomen or pelvis. No
evidence of retroperitoneal hematoma.
2. Bibasilar atelectasis noted.
3. Scattered aortic atherosclerosis.

## 2017-10-18 DIAGNOSIS — M797 Fibromyalgia: Secondary | ICD-10-CM | POA: Diagnosis not present

## 2017-10-18 DIAGNOSIS — R69 Illness, unspecified: Secondary | ICD-10-CM | POA: Diagnosis not present

## 2017-10-18 DIAGNOSIS — I259 Chronic ischemic heart disease, unspecified: Secondary | ICD-10-CM | POA: Diagnosis not present

## 2018-05-30 ENCOUNTER — Telehealth: Payer: Self-pay

## 2018-05-30 NOTE — Telephone Encounter (Signed)
Called patient from recall list.  No answer. LMOV.  This is the second attempt per recall list.
